# Patient Record
Sex: Female | Born: 1985 | Race: White | Hispanic: No | Marital: Married | Smoking: Never smoker
Health system: Southern US, Community
[De-identification: ages and names within clinical notes are randomized; demographics above are authoritative.]

## PROBLEM LIST (undated history)

## (undated) ENCOUNTER — Inpatient Hospital Stay (HOSPITAL_COMMUNITY): Payer: Self-pay

## (undated) DIAGNOSIS — N979 Female infertility, unspecified: Secondary | ICD-10-CM

## (undated) DIAGNOSIS — N809 Endometriosis, unspecified: Secondary | ICD-10-CM

## (undated) DIAGNOSIS — O09299 Supervision of pregnancy with other poor reproductive or obstetric history, unspecified trimester: Secondary | ICD-10-CM

## (undated) DIAGNOSIS — O139 Gestational [pregnancy-induced] hypertension without significant proteinuria, unspecified trimester: Secondary | ICD-10-CM

## (undated) DIAGNOSIS — N83209 Unspecified ovarian cyst, unspecified side: Secondary | ICD-10-CM

## (undated) HISTORY — PX: BREAST ENHANCEMENT SURGERY: SHX7

## (undated) HISTORY — PX: OTHER SURGICAL HISTORY: SHX169

## (undated) HISTORY — PX: BREAST SURGERY: SHX581

## (undated) HISTORY — DX: Supervision of pregnancy with other poor reproductive or obstetric history, unspecified trimester: O09.299

---
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
## 2008-10-12 ENCOUNTER — Telehealth (INDEPENDENT_AMBULATORY_CARE_PROVIDER_SITE_OTHER): Payer: Self-pay | Admitting: *Deleted

## 2016-02-27 ENCOUNTER — Encounter (HOSPITAL_COMMUNITY): Payer: Self-pay | Admitting: Emergency Medicine

## 2016-02-27 ENCOUNTER — Encounter (HOSPITAL_COMMUNITY): Payer: Self-pay | Admitting: *Deleted

## 2016-02-27 ENCOUNTER — Emergency Department (HOSPITAL_COMMUNITY)
Admission: EM | Admit: 2016-02-27 | Discharge: 2016-02-28 | Disposition: A | Discharge status: Home / Self Care | Payer: BLUE CROSS/BLUE SHIELD | Attending: Emergency Medicine | Admitting: Emergency Medicine

## 2016-02-27 ENCOUNTER — Ambulatory Visit (HOSPITAL_COMMUNITY)
Admission: EM | Admit: 2016-02-27 | Discharge: 2016-02-27 | Disposition: A | Discharge status: Home / Self Care | Payer: BLUE CROSS/BLUE SHIELD | Attending: Family Medicine | Admitting: Family Medicine

## 2016-02-27 DIAGNOSIS — R1031 Right lower quadrant pain: Secondary | ICD-10-CM | POA: Insufficient documentation

## 2016-02-27 DIAGNOSIS — R1011 Right upper quadrant pain: Secondary | ICD-10-CM

## 2016-02-27 DIAGNOSIS — R109 Unspecified abdominal pain: Secondary | ICD-10-CM

## 2016-02-27 DIAGNOSIS — M545 Low back pain: Secondary | ICD-10-CM | POA: Diagnosis present

## 2016-02-27 HISTORY — DX: Endometriosis, unspecified: N80.9

## 2016-02-27 LAB — POCT URINALYSIS DIP (DEVICE)
Bilirubin Urine: NEGATIVE
Glucose, UA: NEGATIVE mg/dL
Hgb urine dipstick: NEGATIVE
KETONES UR: NEGATIVE mg/dL
Leukocytes, UA: NEGATIVE
Nitrite: NEGATIVE
PH: 6 (ref 5.0–8.0)
PROTEIN: NEGATIVE mg/dL
SPECIFIC GRAVITY, URINE: 1.025 (ref 1.005–1.030)
Urobilinogen, UA: 0.2 mg/dL (ref 0.0–1.0)

## 2016-02-27 LAB — CBC
HCT: 39.6 % (ref 36.0–46.0)
HEMOGLOBIN: 13.2 g/dL (ref 12.0–15.0)
MCH: 30.8 pg (ref 26.0–34.0)
MCHC: 33.3 g/dL (ref 30.0–36.0)
MCV: 92.5 fL (ref 78.0–100.0)
PLATELETS: 176 10*3/uL (ref 150–400)
RBC: 4.28 MIL/uL (ref 3.87–5.11)
RDW: 12.6 % (ref 11.5–15.5)
WBC: 6.3 10*3/uL (ref 4.0–10.5)

## 2016-02-27 LAB — COMPREHENSIVE METABOLIC PANEL
ALT: 40 U/L (ref 14–54)
ANION GAP: 7 (ref 5–15)
AST: 30 U/L (ref 15–41)
Albumin: 4.1 g/dL (ref 3.5–5.0)
Alkaline Phosphatase: 47 U/L (ref 38–126)
BILIRUBIN TOTAL: 0.7 mg/dL (ref 0.3–1.2)
BUN: 14 mg/dL (ref 6–20)
CALCIUM: 9.5 mg/dL (ref 8.9–10.3)
CO2: 25 mmol/L (ref 22–32)
CREATININE: 0.8 mg/dL (ref 0.44–1.00)
Chloride: 107 mmol/L (ref 101–111)
Glucose, Bld: 113 mg/dL — ABNORMAL HIGH (ref 65–99)
Potassium: 3.6 mmol/L (ref 3.5–5.1)
Sodium: 139 mmol/L (ref 135–145)
TOTAL PROTEIN: 6.8 g/dL (ref 6.5–8.1)

## 2016-02-27 LAB — LIPASE, BLOOD: Lipase: 25 U/L (ref 11–51)

## 2016-02-27 LAB — POCT PREGNANCY, URINE: PREG TEST UR: NEGATIVE

## 2016-02-27 MED ORDER — IBUPROFEN 200 MG PO TABS
ORAL_TABLET | ORAL | Status: AC
  Filled 2016-02-27: qty 3

## 2016-02-27 MED ORDER — IBUPROFEN 400 MG PO TABS
600.0000 mg | ORAL_TABLET | Freq: Once | ORAL | Status: AC
  Administered 2016-02-27: 600 mg via ORAL

## 2016-02-27 NOTE — ED Provider Notes (Signed)
[facility]-EMERGENCY DEPT Provider Note   CSN: [id number] Arrival date & time: 02/27/16  2055  By signing my name below, I, Clovis PuAvnee Patel, attest that this documentation has been prepared under the direction and in the presence of  Damire Remedios, [place]. Electronically Signed: Clovis PuAvnee Patel, ED Scribe. 02/27/16. 12:00 AM.    History   Chief Complaint Chief Complaint  Patient presents with  . Back Pain  . Abdominal Pain     The history is provided by the patient. No language interpreter was used.    HPI Comments:  Desiree Santana is a 30 y.o. female who presents to the Emergency Department complaining of constant, moderate lower right back pain onset 5 PM today. Pt notes associated radiation to RLQ. She states when she is sitting the pain is "dull" and when she is standing and moving the pain is "sharp". She notes the severity of the pain as being a "6/10". Pt denies nausea, vomiting, diarrhea, vaginal bleeding, vaginal discharge, or urinary symptoms. Denies fever or chills. Pt notes she had a C-section in 2015 otherwise no other abdominal surgeries. Pt denies a hx of kidney stones or a recent injury. Denies heavy lifting. [facility] with negative UA and urine pregnancy. She has been taking ibuprofen for her pain.  Past Medical History:  Diagnosis Date  . Endometriosis     There are no active problems to display for this patient.   Past Surgical History:  Procedure Laterality Date  . BREAST ENHANCEMENT SURGERY    . laproscopic endometreosis      OB History    No data available       Home Medications    Prior to Admission medications   Not on File    Family History History reviewed. No pertinent family history.  Social History Social History  Substance Use Topics  . Smoking status: Never Smoker  . Smokeless tobacco: Never Used  . Alcohol use Yes     Comment: social     Allergies   Review of patient's allergies indicates no known  allergies.   Review of Systems Review of Systems 10 systems reviewed and all are negative for acute change except as noted in the HPI.   Physical Exam Updated Vital Signs BP 115/81 (BP Location: Left Arm)   Pulse 73   Temp 98.6 F (37 C) (Oral)   Resp 14   LMP 02/09/2016 (Exact Date)   SpO2 100%   Physical Exam  Constitutional: She appears well-developed and well-nourished. No distress.  Well appearing, NAD  HENT:  Head: Normocephalic and atraumatic.  Neck: Neck supple.  Pulmonary/Chest: Effort normal.  Abdominal: Soft. There is no rebound and no guarding.  Abdomen soft. Nontender. BS normal. No rebounding or guarding. No CVA tenderness. No pain when coughing.  Musculoskeletal:  No midline back tenderness No stepoff or deformity Ambulatory with steady gait  Neurological: She is alert.  Skin: She is not diaphoretic.  Nursing note and vitals reviewed.    ED Treatments / Results  DIAGNOSTIC STUDIES:  Oxygen Saturation is 100% on RA, normal by my interpretation.    COORDINATION OF CARE:  11:54 PM Discussed treatment plan with pt at bedside and pt agreed to plan.  Labs (all labs ordered are listed, but only abnormal results are displayed) Labs Reviewed  COMPREHENSIVE METABOLIC PANEL - Abnormal; Notable for the following:       Result Value   Glucose, Bld 113 (*)    All other  components within normal limits  LIPASE, BLOOD  CBC    EKG  EKG Interpretation None       Radiology No results found.  Procedures Procedures (including critical care time)  Medications Ordered in ED Medications  ibuprofen (ADVIL,MOTRIN) 200 MG tablet (not administered)  ibuprofen (ADVIL,MOTRIN) tablet 600 mg (600 mg Oral Given 02/27/16 2122)     Initial Impression / Assessment and Plan / ED Course  I have reviewed the triage vital signs and the nursing notes.  Pertinent labs & imaging results that were available during my care of the patient were reviewed by me and  considered in my medical decision making (see chart for details).  Clinical Course    Pt is an 30 y.o. female presenting with acute onset right flank pain radiating to her RLQ. Pain is intermittent, waxing waning in severity. No other associated symptoms. She is afebrile and exam nonfocal. No abdominal exam findings. Neuro exam intact. Her labs are unremarkable. Low suspicion for appendicitis, ovarian torsion, TOA, diverticulitis, kidney stone, cholecystitis. Discussed findings with pt. We will hold off on further imaging today. Pt is hemodynamically stable. The patient appears reasonably screened and/or stabilized for discharge and I doubt any other medical condition or other [facility] requiring further screening, evaluation, or treatment in the ED at this time prior to discharge. ER return precautions given.   Final Clinical Impressions(s) / ED Diagnoses   Final diagnoses:  Right flank pain    New Prescriptions New Prescriptions   No medications on file    I personally performed the services described in this documentation, which was scribed in my presence. The recorded information has been reviewed and is accurate.   Carlene Coria, PA-C 02/28/16 0005    Shon Baton, MD 02/28/16 [phone]

## 2016-02-27 NOTE — ED Provider Notes (Signed)
CSN: [id number]     Arrival date & time 02/27/16  1943 History   None    Chief Complaint  Patient presents with  . Abdominal Pain  . Back Pain   (Consider location/radiation/quality/duration/timing/severity/associated sxs/prior Treatment) HPI History obtained from patient:  Pt presents with the cc of:  Right flank and lower quadrant pain Duration of symptoms: 3 hours Treatment prior to arrival: None Context: Patient states that she was sitting on the floor and had sudden onset of right flank and lower right quadrant pain. Not associated with nausea vomiting or diarrhea. Other symptoms include: Radiating pain into the right groin. Pain score: 4 FAMILY HISTORY: None    Past Medical History:  Diagnosis Date  . Endometriosis    Past Surgical History:  Procedure Laterality Date  . BREAST ENHANCEMENT SURGERY    . laproscopic endometreosis     History reviewed. No pertinent family history. Social History  Substance Use Topics  . Smoking status: Never Smoker  . Smokeless tobacco: Never Used  . Alcohol use Yes     Comment: social   OB History    No data available     Review of Systems  Denies: HEADACHE, NAUSEA,  CHEST PAIN, CONGESTION, DYSURIA, SHORTNESS OF BREATH  Allergies  Review of patient's allergies indicates no known allergies.  Home Medications   Prior to Admission medications   Not on File   Meds Ordered and Administered this Visit  Medications - No data to display  LMP 02/09/2016 (Exact Date)  No data found.   Physical Exam NURSES NOTES AND VITAL SIGNS REVIEWED. CONSTITUTIONAL: Well developed, well nourished, no acute distress HEENT: normocephalic, atraumatic EYES: Conjunctiva normal NECK:normal ROM, supple, no adenopathy PULMONARY:No respiratory distress, normal effort ABDOMINAL: Soft, ND, NT BS+, minimal right lower quadrant tenderness. Some right flank tenderness. MUSCULOSKELETAL: Normal ROM of all extremities,  SKIN: warm and dry without  rash PSYCHIATRIC: Mood and affect, behavior are normal  Urgent Care Course   Clinical Course    Procedures (including critical care time)  Labs Review Labs Reviewed  POCT URINALYSIS DIP (DEVICE)  POCT PREGNANCY, URINE    Imaging Review No results found.   Visual Acuity Review  Right Eye Distance:   Left Eye Distance:   Bilateral Distance:    Right Eye Near:   Left Eye Near:    Bilateral Near:         MDM   1. Right lower quadrant abdominal pain   2. Acute right flank pain    It is suggested to patient that she be seen in the emergency department as she is beyond the diagnostic capabilities of urgent care.    Tharon AquasFrank C Hazle Ogburn, PA 02/27/16 2110

## 2016-02-27 NOTE — ED Triage Notes (Signed)
[facility] a [facility] lower back pain and abdominal pain that started today.

## 2016-02-27 NOTE — Discharge Instructions (Signed)
Take ibuprofen or tylenol as needed for pain. As we discussed, your labs today and at urgent care were normal. Return to the emergency room for new or worsening symptoms.

## 2016-02-27 NOTE — Discharge Instructions (Signed)
IT IS SUGGESTED THAT YOU BE SEEN IN THE ER FOR YOUR ACUTE ABDOMINAL PAIN  THE URGENT CARE IS VERY LIMITED ON ITS ABILITY TO EVALUATE ABDOMINAL PAIN.

## 2016-02-27 NOTE — ED Triage Notes (Signed)
Pt reports right lower back pain starting today around 1700 tonight. Pt was sitting on the floor when onset of pain occurred. Pt denies taking any OTC medications for the pain. Pt states pain radiates to right lower abdominal, pt denies dysuria, hematuria. Pt denies fever, chills, n/v/d. Pt was seen at UC, had UA done with normal results.

## 2016-05-30 LAB — OB RESULTS CONSOLE ABO/RH: RH Type: NEGATIVE

## 2016-05-30 LAB — OB RESULTS CONSOLE RPR: RPR: NONREACTIVE

## 2016-05-30 LAB — OB RESULTS CONSOLE GC/CHLAMYDIA
CHLAMYDIA, DNA PROBE: NEGATIVE
GC PROBE AMP, GENITAL: NEGATIVE

## 2016-05-30 LAB — OB RESULTS CONSOLE RUBELLA ANTIBODY, IGM: RUBELLA: IMMUNE

## 2016-05-30 LAB — OB RESULTS CONSOLE HEPATITIS B SURFACE ANTIGEN: HEP B S AG: NEGATIVE

## 2016-05-30 LAB — OB RESULTS CONSOLE HIV ANTIBODY (ROUTINE TESTING): HIV: NONREACTIVE

## 2016-05-30 LAB — OB RESULTS CONSOLE ANTIBODY SCREEN: ANTIBODY SCREEN: NEGATIVE

## 2016-06-18 NOTE — L&D Delivery Note (Signed)
Delivery Note At 3:58 PM a viable female was delivered via VBAC, Spontaneous ROA Presentation   APGAR: 9, 9; weight pending  .   Placenta status:spontaneously with 3 vessel cord  , .  Cord:  with the following complications: none.  Cord pH: not obtained  No uterine window palpated  Anesthesia:  epidural Episiotomy: None Lacerations: Labial Suture Repair: 3.0 chromic Est. Blood Loss (mL): 300  Mom to postpartum.  Baby to Couplet care / Skin to Skin.  Desiree Santana L 01/05/2017, 4:16 PM

## 2016-12-11 ENCOUNTER — Inpatient Hospital Stay (HOSPITAL_COMMUNITY)
Admission: AD | Admit: 2016-12-11 | Discharge: 2016-12-11 | Disposition: A | Discharge status: Home / Self Care | Payer: BLUE CROSS/BLUE SHIELD | Source: Ambulatory Visit | Attending: Obstetrics and Gynecology | Admitting: Obstetrics and Gynecology

## 2016-12-11 ENCOUNTER — Encounter (HOSPITAL_COMMUNITY): Payer: Self-pay | Admitting: *Deleted

## 2016-12-11 DIAGNOSIS — Z3A36 36 weeks gestation of pregnancy: Secondary | ICD-10-CM | POA: Insufficient documentation

## 2016-12-11 DIAGNOSIS — R42 Dizziness and giddiness: Secondary | ICD-10-CM | POA: Insufficient documentation

## 2016-12-11 DIAGNOSIS — O26893 Other specified pregnancy related conditions, third trimester: Secondary | ICD-10-CM | POA: Diagnosis present

## 2016-12-11 DIAGNOSIS — D696 Thrombocytopenia, unspecified: Secondary | ICD-10-CM | POA: Insufficient documentation

## 2016-12-11 DIAGNOSIS — O99113 Other diseases of the blood and blood-forming organs and certain disorders involving the immune mechanism complicating pregnancy, third trimester: Secondary | ICD-10-CM | POA: Insufficient documentation

## 2016-12-11 DIAGNOSIS — O99119 Other diseases of the blood and blood-forming organs and certain disorders involving the immune mechanism complicating pregnancy, unspecified trimester: Secondary | ICD-10-CM | POA: Diagnosis not present

## 2016-12-11 DIAGNOSIS — O9989 Other specified diseases and conditions complicating pregnancy, childbirth and the puerperium: Secondary | ICD-10-CM | POA: Diagnosis not present

## 2016-12-11 DIAGNOSIS — R51 Headache: Secondary | ICD-10-CM | POA: Diagnosis not present

## 2016-12-11 DIAGNOSIS — O09293 Supervision of pregnancy with other poor reproductive or obstetric history, third trimester: Secondary | ICD-10-CM

## 2016-12-11 HISTORY — DX: Gestational (pregnancy-induced) hypertension without significant proteinuria, unspecified trimester: O13.9

## 2016-12-11 HISTORY — DX: Female infertility, unspecified: N97.9

## 2016-12-11 HISTORY — DX: Unspecified ovarian cyst, unspecified side: N83.209

## 2016-12-11 LAB — COMPREHENSIVE METABOLIC PANEL
ALT: 26 U/L (ref 14–54)
ANION GAP: 7 (ref 5–15)
AST: 30 U/L (ref 15–41)
Albumin: 2.8 g/dL — ABNORMAL LOW (ref 3.5–5.0)
Alkaline Phosphatase: 121 U/L (ref 38–126)
BUN: 9 mg/dL (ref 6–20)
CHLORIDE: 107 mmol/L (ref 101–111)
CO2: 19 mmol/L — ABNORMAL LOW (ref 22–32)
CREATININE: 0.47 mg/dL (ref 0.44–1.00)
Calcium: 8.4 mg/dL — ABNORMAL LOW (ref 8.9–10.3)
GFR calc Af Amer: >60 mL/min (ref >60)
Glucose, Bld: 90 mg/dL (ref 65–99)
POTASSIUM: 3.2 mmol/L — AB (ref 3.5–5.1)
Sodium: 133 mmol/L — ABNORMAL LOW (ref 135–145)
Total Bilirubin: 0.4 mg/dL (ref 0.3–1.2)
Total Protein: 6.1 g/dL — ABNORMAL LOW (ref 6.5–8.1)

## 2016-12-11 LAB — CBC
HCT: 36.7 % (ref 36.0–46.0)
Hemoglobin: 12.8 g/dL (ref 12.0–15.0)
MCH: 32.7 pg (ref 26.0–34.0)
MCHC: 34.9 g/dL (ref 30.0–36.0)
MCV: 93.6 fL (ref 78.0–100.0)
PLATELETS: 123 10*3/uL — AB (ref 150–400)
RBC: 3.92 MIL/uL (ref 3.87–5.11)
RDW: 13 % (ref 11.5–15.5)
WBC: 5.6 10*3/uL (ref 4.0–10.5)

## 2016-12-11 LAB — URINALYSIS, ROUTINE W REFLEX MICROSCOPIC
BILIRUBIN URINE: NEGATIVE
Glucose, UA: NEGATIVE mg/dL
HGB URINE DIPSTICK: NEGATIVE
KETONES UR: 5 mg/dL — AB
Leukocytes, UA: NEGATIVE
Nitrite: NEGATIVE
PH: 6 (ref 5.0–8.0)
Protein, ur: NEGATIVE mg/dL
SPECIFIC GRAVITY, URINE: 1.005 (ref 1.005–1.030)

## 2016-12-11 LAB — PROTEIN / CREATININE RATIO, URINE
CREATININE, URINE: 45 mg/dL
Total Protein, Urine: <6 mg/dL

## 2016-12-11 MED ORDER — BUTALBITAL-APAP-CAFFEINE 50-325-40 MG PO TABS
1.0000 | ORAL_TABLET | Freq: Once | ORAL | Status: AC
  Administered 2016-12-11: 1 via ORAL
  Filled 2016-12-11: qty 1

## 2016-12-11 MED ORDER — FAMOTIDINE 20 MG PO TABS
20.0000 mg | ORAL_TABLET | Freq: Once | ORAL | Status: AC
  Administered 2016-12-11: 20 mg via ORAL
  Filled 2016-12-11: qty 1

## 2016-12-11 MED ORDER — BUTALBITAL-APAP-CAFFEINE 50-325-40 MG PO CAPS: 1.0000 | ORAL_CAPSULE | Freq: Four times a day (QID) | ORAL | 3 refills | Status: DC | PRN

## 2016-12-11 MED ORDER — RANITIDINE HCL 75 MG PO TABS: 75.0000 mg | ORAL_TABLET | Freq: Two times a day (BID) | ORAL | 1 refills | Status: DC

## 2016-12-11 NOTE — MAU Note (Signed)
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  Starting to have same symptoms, started yesterday.  HA, indigestion/heartburn, dizzy.

## 2016-12-11 NOTE — Discharge Instructions (Signed)
Thrombocytopenia Thrombocytopenia is a condition in which you have an abnormally decreased number of platelets in your blood. Platelets are also called thrombocytes. Platelets are needed for blood clotting. Some cases of thrombocytopenia are mild while others are more severe. What are the causes? This condition may be caused by:  Decreased production of platelets. This can be caused by: ? Aplastic anemia, in which your bone marrow quits making blood cells. ? Cancer in the bone marrow. ? Use of certain medicines, including chemotherapy. ? Infection in the bone marrow. ? Heavy alcohol consumption.  Increased destruction of platelets. This can be caused by: ? Certain immune diseases. ? Use of certain drugs. ? Certain blood clotting disorders. ? Certain inherited disorders. ? Certain bleeding disorders. ? Pregnancy.  Having an enlarged spleen (hypersplenism). In hypersplenism, the spleen gathers up platelets from circulation. This means that the platelets are not available to help with blood clotting. The spleen can be enlarged because of cirrhosis or other conditions.  What are the signs or symptoms? Symptoms of this condition are side effects of poor blood clotting. They will vary depending on how low the platelet counts are. Symptoms may include:  Abnormal bleeding.  Nosebleeds.  Heavy menstrual periods.  Blood in the urine or stool (feces).  A purplish discoloration in the skin (purpura).  Bruising.  A rash that looks like pinpoint, purplish-red spots (petechiae) on the skin and mucous membranes.  How is this diagnosed? This condition may be diagnosed with blood tests and a physical exam. Sometimes, a sample of bone marrow may be removed to look for the original cells (megakaryocytes) that make platelets. How is this treated? Treatment for this condition depends on the cause. Treatment options may include:  Treatment of another condition that is causing the low platelet  count.  Medicines to help protect your platelets from being destroyed.  A replacement (transfusion) of platelets to stop or prevent bleeding.  Surgery to remove the spleen.  Follow these instructions at home: General instructions  Check your skin and the linings inside your mouth for bruising or bleeding as told by your health care provider.  Check your sputum, urine, and stool for blood as told by your health care provider.  Ask your health care provider if it is okay for you to drink alcohol.  Take over-the-counter and prescription medicines only as told by your health care provider.  Tell all of your health care providers, including dentists and eye doctors, about your condition. Activity  Until your health care provider says it is okay. ? Do not return to any activities that could cause bumps or bruises.  Take extra care not to cut yourself when you shave or when you use scissors, needles, knives, and other tools.  Take extra care not to burn yourself when ironing or cooking. Contact a health care provider if:  You have unexplained bruising. Get help right away if:  You have active bleeding from anywhere on your body.  You have blood in your sputum, urine, or stool. This information is not intended to replace advice given to you by your health care provider. Make sure you discuss any questions you have with your health care provider. Document Released: 06/04/2005 Document Revised: 02/05/2016 Document Reviewed: 12/06/2014 Elsevier Interactive Patient Education  2018 [employer]. Third Trimester of Pregnancy The third trimester is from week 28 through week 40 (months 7 through 9). The third trimester is a time when the unborn baby (fetus) is growing rapidly. At the end of  the ninth month, the fetus is about 20 inches in length and weighs 6-10 pounds. Body changes during your third trimester Your body will continue to go through many changes during pregnancy. The changes  vary from woman to woman. During the third trimester:  Your weight will continue to increase. You can expect to gain 25-35 pounds (11-16 kg) by the end of the pregnancy.  You may begin to get stretch marks on your hips, abdomen, and breasts.  You may urinate more often because the fetus is moving lower into your pelvis and pressing on your bladder.  You may develop or continue to have heartburn. This is caused by increased hormones that slow down muscles in the digestive tract.  You may develop or continue to have constipation because increased hormones slow digestion and cause the muscles that push waste through your intestines to relax.  You may develop hemorrhoids. These are swollen veins (varicose veins) in the rectum that can itch or be painful.  You may develop swollen, bulging veins (varicose veins) in your legs.  You may have increased body aches in the pelvis, back, or thighs. This is due to weight gain and increased hormones that are relaxing your joints.  You may have changes in your hair. These can include thickening of your hair, rapid growth, and changes in texture. Some women also have hair loss during or after pregnancy, or hair that feels dry or thin. Your hair will most likely return to normal after your baby is born.  Your breasts will continue to grow and they will continue to become tender. A yellow fluid (colostrum) may leak from your breasts. This is the first milk you are producing for your baby.  Your belly button may stick out.  You may notice more swelling in your hands, face, or ankles.  You may have increased tingling or numbness in your hands, arms, and legs. The skin on your belly may also feel numb.  You may feel short of breath because of your expanding uterus.  You may have more problems sleeping. This can be caused by the size of your belly, increased need to urinate, and an increase in your body's metabolism.  You may notice the fetus "dropping," or  moving lower in your abdomen (lightening).  You may have increased vaginal discharge.  You may notice your joints feel loose and you may have pain around your pelvic bone.  What to expect at prenatal visits You will have prenatal exams every 2 weeks until week 36. Then you will have weekly prenatal exams. During a routine prenatal visit:  You will be weighed to make sure you and the baby are growing normally.  Your blood pressure will be taken.  Your abdomen will be measured to track your baby's growth.  The fetal heartbeat will be listened to.  Any test results from the previous visit will be discussed.  You may have a cervical check near your due date to see if your cervix has softened or thinned (effaced).  You will be tested for Group B streptococcus. This happens between 35 and 37 weeks.  Your health care provider may ask you:  What your birth plan is.  How you are feeling.  If you are feeling the baby move.  If you have had any abnormal symptoms, such as leaking fluid, bleeding, severe headaches, or abdominal cramping.  If you are using any tobacco products, including cigarettes, chewing tobacco, and electronic cigarettes.  If you have any questions.  Other tests or screenings that may be performed during your third trimester include:  Blood tests that check for low iron levels (anemia).  Fetal testing to check the health, activity level, and growth of the fetus. Testing is done if you have certain medical conditions or if there are problems during the pregnancy.  Nonstress test (NST). This test checks the health of your baby to make sure there are no signs of problems, such as the baby not getting enough oxygen. During this test, a belt is placed around your belly. The baby is made to move, and its heart rate is monitored during movement.  What is false labor? False labor is a condition in which you feel small, irregular tightenings of the muscles in the womb  (contractions) that usually go away with rest, changing position, or drinking water. These are called Braxton Hicks contractions. Contractions may last for hours, days, or even weeks before true labor sets in. If contractions come at regular intervals, become more frequent, increase in intensity, or become painful, you should see your health care provider. What are the signs of labor?  Abdominal cramps.  Regular contractions that start at 10 minutes apart and become stronger and more frequent with time.  Contractions that start on the top of the uterus and spread down to the lower abdomen and back.  Increased pelvic pressure and dull back pain.  A watery or bloody mucus discharge that comes from the vagina.  Leaking of amniotic fluid. This is also known as your "water breaking." It could be a slow trickle or a gush. Let your health care provider know if it has a color or strange odor. If you have any of these signs, call your health care provider right away, even if it is before your due date. Follow these instructions at home: Medicines  Follow your health care provider's instructions regarding medicine use. Specific medicines may be either safe or unsafe to take during pregnancy.  Take a prenatal vitamin that contains at least 600 micrograms (mcg) of folic acid.  If you develop constipation, try taking a stool softener if your health care provider approves. Eating and drinking  Eat a balanced diet that includes fresh fruits and vegetables, whole grains, good sources of protein such as meat, eggs, or tofu, and low-fat dairy. Your health care provider will help you determine the amount of weight gain that is right for you.  Avoid raw meat and uncooked cheese. These carry germs that can cause birth defects in the baby.  If you have low calcium intake from food, talk to your health care provider about whether you should take a daily calcium supplement.  Eat four or five small meals  rather than three large meals a day.  Limit foods that are high in fat and processed sugars, such as fried and sweet foods.  To prevent constipation: ? Drink enough fluid to keep your urine clear or pale yellow. ? Eat foods that are high in fiber, such as fresh fruits and vegetables, whole grains, and beans. Activity  Exercise only as directed by your health care provider. Most women can continue their usual exercise routine during pregnancy. Try to exercise for 30 minutes at least 5 days a week. Stop exercising if you experience uterine contractions.  Avoid heavy lifting.  Do not exercise in extreme heat or humidity, or at high altitudes.  Wear low-heel, comfortable shoes.  Practice good posture.  You may continue to have sex unless your health care provider  tells you otherwise. Relieving pain and discomfort  Take frequent breaks and rest with your legs elevated if you have leg cramps or low back pain.  Take warm sitz baths to soothe any pain or discomfort caused by hemorrhoids. Use hemorrhoid cream if your health care provider approves.  Wear a good support bra to prevent discomfort from breast tenderness.  If you develop varicose veins: ? Wear support pantyhose or compression stockings as told by your healthcare provider. ? Elevate your feet for 15 minutes, 3-4 times a day. Prenatal care  Write down your questions. Take them to your prenatal visits.  Keep all your prenatal visits as told by your health care provider. This is important. Safety  Wear your seat belt at all times when driving.  Make a list of emergency phone numbers, including numbers for family, friends, the hospital, and police and fire departments. General instructions  Avoid cat litter boxes and soil used by cats. These carry germs that can cause birth defects in the baby. If you have a cat, ask someone to clean the litter box for you.  Do not travel far distances unless it is absolutely necessary and  only with the approval of your health care provider.  Do not use hot tubs, steam rooms, or saunas.  Do not drink alcohol.  Do not use any products that contain nicotine or tobacco, such as cigarettes and e-cigarettes. If you need help quitting, ask your health care provider.  Do not use any medicinal herbs or unprescribed drugs. These chemicals affect the formation and growth of the baby.  Do not douche or use tampons or scented sanitary pads.  Do not cross your legs for long periods of time.  To prepare for the arrival of your baby: ? Take prenatal classes to understand, practice, and ask questions about labor and delivery. ? Make a trial run to the hospital. ? Visit the hospital and tour the maternity area. ? Arrange for maternity or paternity leave through employers. ? Arrange for family and friends to take care of pets while you are in the hospital. ? Purchase a rear-facing car seat and make sure you know how to install it in your car. ? Pack your hospital bag. ? Prepare the babys nursery. Make sure to remove all pillows and stuffed animals from the baby's crib to prevent suffocation.  Visit your dentist if you have not gone during your pregnancy. Use a soft toothbrush to brush your teeth and be gentle when you floss. Contact a health care provider if:  You are unsure if you are in labor or if your water has broken.  You become dizzy.  You have mild pelvic cramps, pelvic pressure, or nagging pain in your abdominal area.  You have lower back pain.  You have persistent nausea, vomiting, or diarrhea.  You have an unusual or bad smelling vaginal discharge.  You have pain when you urinate. Get help right away if:  Your water breaks before 37 weeks.  You have regular contractions less than 5 minutes apart before 37 weeks.  You have a fever.  You are leaking fluid from your vagina.  You have spotting or bleeding from your vagina.  You have severe abdominal pain or  cramping.  You have rapid weight loss or weight gain.  You have shortness of breath with chest pain.  You notice sudden or extreme swelling of your face, hands, ankles, feet, or legs.  Your baby makes fewer than 10 movements in 2 hours.  You have severe headaches that do not go away when you take medicine.  You have vision changes. Summary  The third trimester is from week 28 through week 40, months 7 through 9. The third trimester is a time when the unborn baby (fetus) is growing rapidly.  During the third trimester, your discomfort may increase as you and your baby continue to gain weight. You may have abdominal, leg, and back pain, sleeping problems, and an increased need to urinate.  During the third trimester your breasts will keep growing and they will continue to become tender. A yellow fluid (colostrum) may leak from your breasts. This is the first milk you are producing for your baby.  False labor is a condition in which you feel small, irregular tightenings of the muscles in the womb (contractions) that eventually go away. These are called Braxton Hicks contractions. Contractions may last for hours, days, or even weeks before true labor sets in.  Signs of labor can include: abdominal cramps; regular contractions that start at 10 minutes apart and become stronger and more frequent with time; watery or bloody mucus discharge that comes from the vagina; increased pelvic pressure and dull back pain; and leaking of amniotic fluid. This information is not intended to replace advice given to you by your health care provider. Make sure you discuss any questions you have with your health care provider. Document Released: 05/29/2001 Document Revised: 11/10/2015 Document Reviewed: 08/05/2012 Elsevier Interactive Patient Education  2017 [employer].

## 2016-12-11 NOTE — MAU Provider Note (Signed)
Chief Complaint:  No chief complaint on file.   First Provider Initiated Contact with Patient 12/11/16 1759     HPI: Desiree Santana is a 31 y.o. No obstetric history on file[facility] headache (partially relieved by Tylenol), heartburn, and dizziness. She reports good fetal movement, denies LOF, vaginal bleeding, vaginal itching/burning, urinary symptoms, h/a, dizziness, n/v, diarrhea, constipation or fever/chills.  She denies visual changes. 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 Never had elevated BPs until late.  Lab abnormalities primarily. [place]  Headache   This is a new problem. The current episode started today. The problem occurs constantly. The problem has been gradually improving. The pain is located in the frontal region. The pain does not radiate. Associated symptoms include dizziness. Pertinent negatives include no abdominal pain, back pain, blurred vision, nausea, neck pain, photophobia, visual change or weakness. Nothing aggravates the symptoms. She has tried nothing for the symptoms.  Gastroesophageal Reflux  She reports no abdominal pain or no nausea. This is a new problem. The current episode started today. The problem occurs constantly. The problem has been unchanged. Nothing aggravates the symptoms. She has tried nothing for the symptoms.  Dizziness  This is a new problem. The current episode started today. The problem occurs intermittently. The problem has been unchanged. Associated symptoms include headaches. Pertinent negatives include no abdominal pain, nausea, neck pain, visual change or weakness. Nothing aggravates the symptoms. She has tried nothing for the symptoms.    Past Medical History: Past Medical History:  Diagnosis Date  . Endometriosis     Past obstetric history: OB History  No data available    Past Surgical History: Past Surgical History:   Procedure Laterality Date  . BREAST ENHANCEMENT SURGERY    . laproscopic endometreosis      Family History: No family history on file.  Social History: Social History  Substance Use Topics  . Smoking status: Never Smoker  . Smokeless tobacco: Never Used  . Alcohol use Yes     Comment: social    Allergies: No Known Allergies  Meds:  No prescriptions prior to admission.    I have reviewed patient's Past Medical Hx, Surgical Hx, Family Hx, Social Hx, medications and allergies.   ROS:  Review of Systems  Eyes: Negative for blurred vision and photophobia.  Gastrointestinal: Negative for abdominal pain and nausea.  Musculoskeletal: Negative for back pain and neck pain.  Neurological: Positive for dizziness and headaches. Negative for weakness.   Other systems negative  Physical Exam   Vitals:   12/11/16 1912 12/11/16 1915 12/11/16 1930 12/11/16 2010  BP:  112/73 (!) 98/55   Pulse:  82 83   Resp:      Temp:    97.6 F (36.4 C)  TempSrc:    Oral  SpO2: 100%       Constitutional: Well-developed, well-nourished female in no acute distress.  Cardiovascular: normal rate and rhythm Respiratory: normal effort, clear to auscultation bilaterally GI: Abd soft, non-tender, gravid appropriate for gestational age.   No rebound or guarding. MS: Extremities nontender, no edema, normal ROM Neurologic: Alert and oriented x 4. DTRs 1+ with no clonus. GU: Neg CVAT.  PELVIC EXAM: deferred    FHT:  Baseline 140 , moderate variability, accelerations present, no decelerations Contractions: q 2-7 mins Irregular     Labs:    Results for orders placed or performed during the hospital encounter  of 12/11/16 (from the past 24 hour(s))  Urinalysis, Routine w reflex microscopic     Status: Abnormal   Collection Time: 12/11/16  5:50 PM  Result Value Ref Range   Color, Urine YELLOW YELLOW   APPearance CLEAR CLEAR   Specific Gravity, Urine 1.005 1.005 - 1.030   pH 6.0 5.0 - 8.0    Glucose, UA NEGATIVE NEGATIVE mg/dL   Hgb urine dipstick NEGATIVE NEGATIVE   Bilirubin Urine NEGATIVE NEGATIVE   Ketones, ur 5 (A) NEGATIVE mg/dL   Protein, ur NEGATIVE NEGATIVE mg/dL   Nitrite NEGATIVE NEGATIVE   Leukocytes, UA NEGATIVE NEGATIVE  Protein / creatinine ratio, urine     Status: None   Collection Time: 12/11/16  5:50 PM  Result Value Ref Range   Creatinine, Urine 45.00 mg/dL   Total Protein, Urine <6 mg/dL   Protein Creatinine Ratio        0.00 - 0.15 mg/mg[Cre]  CBC     Status: Abnormal   Collection Time: 12/11/16  6:24 PM  Result Value Ref Range   WBC 5.6 4.0 - 10.5 K/uL   RBC 3.92 3.87 - 5.11 MIL/uL   Hemoglobin 12.8 12.0 - 15.0 g/dL   HCT 16.1 09.6 - 04.5 %   MCV 93.6 78.0 - 100.0 fL   MCH 32.7 26.0 - 34.0 pg   MCHC 34.9 30.0 - 36.0 g/dL   RDW 40.9 81.1 - 91.4 %   Platelets 123 (L) 150 - 400 K/uL  Comprehensive metabolic panel     Status: Abnormal   Collection Time: 12/11/16  6:24 PM  Result Value Ref Range   Sodium 133 (L) 135 - 145 mmol/L   Potassium 3.2 (L) 3.5 - 5.1 mmol/L   Chloride 107 101 - 111 mmol/L   CO2 19 (L) 22 - 32 mmol/L   Glucose, Bld 90 65 - 99 mg/dL   BUN 9 6 - 20 mg/dL   Creatinine, Ser 7.82 0.44 - 1.00 mg/dL   Calcium 8.4 (L) 8.9 - 10.3 mg/dL   Total Protein 6.1 (L) 6.5 - 8.1 g/dL   Albumin 2.8 (L) 3.5 - 5.0 g/dL   AST 30 15 - 41 U/L   ALT 26 14 - 54 U/L   Alkaline Phosphatase 121 38 - 126 U/L   Total Bilirubin 0.4 0.3 - 1.2 mg/dL   GFR calc non Af Amer >60 >60 mL/min   GFR calc Af Amer >60 >60 mL/min   Anion gap 7 5 - 15     Imaging:  No results found.  MAU Course/MDM: I have ordered labs and reviewed results.  NST reviewed Consult Dr Elon Spanner with presentation, exam findings and test results.  Treatments in MAU included EFM.    Assessment: Thrombocytopenia complicating pregnancy (HCC) - Plan: Discharge patient  H/O HELLP syndrome, currently pregnant, third trimester - Plan: Discharge patient   Plan: Discharge  home Labor precautions and fetal kick counts Follow up in Office for prenatal visits and recheck of platelets next week Preeclampsia precautions  Pt stable at time of discharge.  Wynelle Bourgeois CNM, MSN Certified Nurse-Midwife 12/11/2016 5:59 PM

## 2017-01-04 ENCOUNTER — Encounter (HOSPITAL_COMMUNITY): Payer: Self-pay | Admitting: *Deleted

## 2017-01-04 ENCOUNTER — Telehealth (HOSPITAL_COMMUNITY): Payer: Self-pay | Admitting: *Deleted

## 2017-01-04 LAB — OB RESULTS CONSOLE GBS: GBS: NEGATIVE

## 2017-01-04 NOTE — Telephone Encounter (Signed)
Preadmission screen  

## 2017-01-05 ENCOUNTER — Inpatient Hospital Stay (HOSPITAL_COMMUNITY)
Admission: AD | Admit: 2017-01-05 | Discharge: 2017-01-07 | DRG: 775 | Disposition: A | Discharge status: Home / Self Care | Payer: BLUE CROSS/BLUE SHIELD | Source: Ambulatory Visit | Attending: Obstetrics and Gynecology | Admitting: Obstetrics and Gynecology

## 2017-01-05 ENCOUNTER — Inpatient Hospital Stay (HOSPITAL_COMMUNITY): Payer: BLUE CROSS/BLUE SHIELD | Admitting: Anesthesiology

## 2017-01-05 ENCOUNTER — Encounter (HOSPITAL_COMMUNITY): Payer: Self-pay

## 2017-01-05 DIAGNOSIS — Z3493 Encounter for supervision of normal pregnancy, unspecified, third trimester: Secondary | ICD-10-CM | POA: Diagnosis present

## 2017-01-05 DIAGNOSIS — O34211 Maternal care for low transverse scar from previous cesarean delivery: Secondary | ICD-10-CM | POA: Diagnosis present

## 2017-01-05 DIAGNOSIS — Z3A39 39 weeks gestation of pregnancy: Secondary | ICD-10-CM

## 2017-01-05 LAB — TYPE AND SCREEN
ABO/RH(D): A NEG
Antibody Screen: NEGATIVE

## 2017-01-05 LAB — ABO/RH: ABO/RH(D): A NEG

## 2017-01-05 LAB — URINALYSIS, ROUTINE W REFLEX MICROSCOPIC
BILIRUBIN URINE: NEGATIVE
Glucose, UA: NEGATIVE mg/dL
KETONES UR: NEGATIVE mg/dL
LEUKOCYTES UA: NEGATIVE
Nitrite: NEGATIVE
Protein, ur: NEGATIVE mg/dL
Specific Gravity, Urine: 1.014 (ref 1.005–1.030)
pH: 6 (ref 5.0–8.0)

## 2017-01-05 LAB — CBC
HEMATOCRIT: 35.7 % — AB (ref 36.0–46.0)
Hemoglobin: 12.6 g/dL (ref 12.0–15.0)
MCH: 32.8 pg (ref 26.0–34.0)
MCHC: 35.3 g/dL (ref 30.0–36.0)
MCV: 93 fL (ref 78.0–100.0)
Platelets: 126 10*3/uL — ABNORMAL LOW (ref 150–400)
RBC: 3.84 MIL/uL — ABNORMAL LOW (ref 3.87–5.11)
RDW: 13.2 % (ref 11.5–15.5)
WBC: 14.1 10*3/uL — ABNORMAL HIGH (ref 4.0–10.5)

## 2017-01-05 MED ORDER — SENNOSIDES-DOCUSATE SODIUM 8.6-50 MG PO TABS
2.0000 | ORAL_TABLET | ORAL | Status: DC
  Administered 2017-01-06 (×2): 2 via ORAL
  Filled 2017-01-05 (×2): qty 2

## 2017-01-05 MED ORDER — LIDOCAINE HCL (PF) 1 % IJ SOLN
30.0000 mL | INTRAMUSCULAR | Status: DC | PRN
  Filled 2017-01-05: qty 30

## 2017-01-05 MED ORDER — OXYCODONE-ACETAMINOPHEN 5-325 MG PO TABS: 2.0000 | ORAL_TABLET | ORAL | Status: DC | PRN

## 2017-01-05 MED ORDER — SIMETHICONE 80 MG PO CHEW: 80.0000 mg | CHEWABLE_TABLET | ORAL | Status: DC | PRN

## 2017-01-05 MED ORDER — LACTATED RINGERS IV SOLN: 500.0000 mL | INTRAVENOUS | Status: DC | PRN

## 2017-01-05 MED ORDER — PHENYLEPHRINE 40 MCG/ML (10ML) SYRINGE FOR IV PUSH (FOR BLOOD PRESSURE SUPPORT)
PREFILLED_SYRINGE | INTRAVENOUS | Status: AC
  Filled 2017-01-05: qty 10

## 2017-01-05 MED ORDER — OXYCODONE-ACETAMINOPHEN 5-325 MG PO TABS: 1.0000 | ORAL_TABLET | ORAL | Status: DC | PRN

## 2017-01-05 MED ORDER — COCONUT OIL OIL
1.0000 "application " | TOPICAL_OIL | Status: DC | PRN
  Filled 2017-01-05: qty 120

## 2017-01-05 MED ORDER — TETANUS-DIPHTH-ACELL PERTUSSIS 5-2.5-18.5 LF-MCG/0.5 IM SUSP: 0.5000 mL | Freq: Once | INTRAMUSCULAR | Status: DC

## 2017-01-05 MED ORDER — OXYTOCIN 40 UNITS IN LACTATED RINGERS INFUSION - SIMPLE MED
1.0000 m[IU]/min | INTRAVENOUS | Status: DC
  Administered 2017-01-05: 1 m[IU]/min via INTRAVENOUS

## 2017-01-05 MED ORDER — MEASLES, MUMPS & RUBELLA VAC ~~LOC~~ INJ: 0.5000 mL | INJECTION | Freq: Once | SUBCUTANEOUS | Status: DC

## 2017-01-05 MED ORDER — FENTANYL 2.5 MCG/ML BUPIVACAINE 1/10 % EPIDURAL INFUSION (WH - ANES)
14.0000 mL/h | INTRAMUSCULAR | Status: DC | PRN
  Administered 2017-01-05 (×2): 14 mL/h via EPIDURAL
  Filled 2017-01-05: qty 100

## 2017-01-05 MED ORDER — DIPHENHYDRAMINE HCL 50 MG/ML IJ SOLN
12.5000 mg | INTRAMUSCULAR | Status: DC | PRN
Start: 2017-01-05 — End: 2017-01-05

## 2017-01-05 MED ORDER — EPHEDRINE 5 MG/ML INJ: 10.0000 mg | INTRAVENOUS | Status: DC | PRN

## 2017-01-05 MED ORDER — ONDANSETRON HCL 4 MG PO TABS: 4.0000 mg | ORAL_TABLET | ORAL | Status: DC | PRN

## 2017-01-05 MED ORDER — ACETAMINOPHEN 325 MG PO TABS
650.0000 mg | ORAL_TABLET | ORAL | Status: DC | PRN
  Administered 2017-01-07: 650 mg via ORAL
  Filled 2017-01-05: qty 2

## 2017-01-05 MED ORDER — DIPHENHYDRAMINE HCL 50 MG/ML IJ SOLN: 12.5000 mg | INTRAMUSCULAR | Status: DC | PRN

## 2017-01-05 MED ORDER — FLEET ENEMA 7-19 GM/118ML RE ENEM: 1.0000 | ENEMA | Freq: Every day | RECTAL | Status: DC | PRN

## 2017-01-05 MED ORDER — ZOLPIDEM TARTRATE 5 MG PO TABS: 5.0000 mg | ORAL_TABLET | Freq: Every evening | ORAL | Status: DC | PRN

## 2017-01-05 MED ORDER — PHENYLEPHRINE 40 MCG/ML (10ML) SYRINGE FOR IV PUSH (FOR BLOOD PRESSURE SUPPORT)
80.0000 ug | PREFILLED_SYRINGE | INTRAVENOUS | Status: DC | PRN
  Filled 2017-01-05: qty 5

## 2017-01-05 MED ORDER — DIPHENHYDRAMINE HCL 25 MG PO CAPS: 25.0000 mg | ORAL_CAPSULE | Freq: Four times a day (QID) | ORAL | Status: DC | PRN

## 2017-01-05 MED ORDER — DIBUCAINE 1 % RE OINT
1.0000 "application " | TOPICAL_OINTMENT | RECTAL | Status: DC | PRN
  Administered 2017-01-06: 1 via RECTAL
  Filled 2017-01-05: qty 28

## 2017-01-05 MED ORDER — LIDOCAINE HCL (PF) 1 % IJ SOLN
INTRAMUSCULAR | Status: DC | PRN
  Administered 2017-01-05: 5 mL
  Administered 2017-01-05: 5 mL via EPIDURAL

## 2017-01-05 MED ORDER — MEDROXYPROGESTERONE ACETATE 150 MG/ML IM SUSP: 150.0000 mg | INTRAMUSCULAR | Status: DC | PRN

## 2017-01-05 MED ORDER — FENTANYL 2.5 MCG/ML BUPIVACAINE 1/10 % EPIDURAL INFUSION (WH - ANES): 14.0000 mL/h | INTRAMUSCULAR | Status: DC | PRN

## 2017-01-05 MED ORDER — EPHEDRINE 5 MG/ML INJ
10.0000 mg | INTRAVENOUS | Status: DC | PRN
  Filled 2017-01-05: qty 2

## 2017-01-05 MED ORDER — FLEET ENEMA 7-19 GM/118ML RE ENEM: 1.0000 | ENEMA | RECTAL | Status: DC | PRN

## 2017-01-05 MED ORDER — FENTANYL 2.5 MCG/ML BUPIVACAINE 1/10 % EPIDURAL INFUSION (WH - ANES)
INTRAMUSCULAR | Status: AC
  Filled 2017-01-05: qty 100

## 2017-01-05 MED ORDER — ONDANSETRON HCL 4 MG/2ML IJ SOLN: 4.0000 mg | INTRAMUSCULAR | Status: DC | PRN

## 2017-01-05 MED ORDER — SOD CITRATE-CITRIC ACID 500-334 MG/5ML PO SOLN: 30.0000 mL | ORAL | Status: DC | PRN

## 2017-01-05 MED ORDER — BENZOCAINE-MENTHOL 20-0.5 % EX AERO
1.0000 "application " | INHALATION_SPRAY | CUTANEOUS | Status: DC | PRN
  Administered 2017-01-07: 1 via TOPICAL
  Filled 2017-01-05 (×2): qty 56

## 2017-01-05 MED ORDER — ONDANSETRON HCL 4 MG/2ML IJ SOLN
4.0000 mg | Freq: Four times a day (QID) | INTRAMUSCULAR | Status: DC | PRN
  Administered 2017-01-05: 4 mg via INTRAVENOUS
  Filled 2017-01-05: qty 2

## 2017-01-05 MED ORDER — LACTATED RINGERS IV SOLN
INTRAVENOUS | Status: DC
  Administered 2017-01-05: 04:00:00 via INTRAVENOUS

## 2017-01-05 MED ORDER — PHENYLEPHRINE 40 MCG/ML (10ML) SYRINGE FOR IV PUSH (FOR BLOOD PRESSURE SUPPORT)
80.0000 ug | PREFILLED_SYRINGE | INTRAVENOUS | Status: DC | PRN
  Administered 2017-01-05: 80 ug via INTRAVENOUS
  Filled 2017-01-05: qty 5

## 2017-01-05 MED ORDER — PRENATAL MULTIVITAMIN CH
1.0000 | ORAL_TABLET | Freq: Every day | ORAL | Status: DC
  Administered 2017-01-06: 1 via ORAL
  Filled 2017-01-05: qty 1

## 2017-01-05 MED ORDER — ACETAMINOPHEN 325 MG PO TABS: 650.0000 mg | ORAL_TABLET | ORAL | Status: DC | PRN

## 2017-01-05 MED ORDER — PHENYLEPHRINE 40 MCG/ML (10ML) SYRINGE FOR IV PUSH (FOR BLOOD PRESSURE SUPPORT): 80.0000 ug | PREFILLED_SYRINGE | INTRAVENOUS | Status: DC | PRN

## 2017-01-05 MED ORDER — TERBUTALINE SULFATE 1 MG/ML IJ SOLN
0.2500 mg | Freq: Once | INTRAMUSCULAR | Status: DC | PRN
  Filled 2017-01-05: qty 1

## 2017-01-05 MED ORDER — IBUPROFEN 600 MG PO TABS
600.0000 mg | ORAL_TABLET | Freq: Four times a day (QID) | ORAL | Status: DC
  Administered 2017-01-05 – 2017-01-07 (×7): 600 mg via ORAL
  Filled 2017-01-05 (×7): qty 1

## 2017-01-05 MED ORDER — OXYTOCIN BOLUS FROM INFUSION
500.0000 mL | Freq: Once | INTRAVENOUS | Status: AC
  Administered 2017-01-05: 500 mL via INTRAVENOUS

## 2017-01-05 MED ORDER — LACTATED RINGERS IV SOLN
500.0000 mL | Freq: Once | INTRAVENOUS | Status: AC
  Administered 2017-01-05: 500 mL via INTRAVENOUS

## 2017-01-05 MED ORDER — OXYTOCIN 40 UNITS IN LACTATED RINGERS INFUSION - SIMPLE MED
2.5000 [IU]/h | INTRAVENOUS | Status: DC
  Filled 2017-01-05: qty 1000

## 2017-01-05 MED ORDER — LACTATED RINGERS IV SOLN: 500.0000 mL | Freq: Once | INTRAVENOUS | Status: DC

## 2017-01-05 MED ORDER — WITCH HAZEL-GLYCERIN EX PADS: 1.0000 "application " | MEDICATED_PAD | CUTANEOUS | Status: DC | PRN

## 2017-01-05 MED ORDER — BISACODYL 10 MG RE SUPP: 10.0000 mg | Freq: Every day | RECTAL | Status: DC | PRN

## 2017-01-05 NOTE — MAU Note (Signed)
Membranes stripped Thurs and cervix 1cm. Regular ctxs since 1800 with some bloody show. For TOLAC (first c/s for triplets)

## 2017-01-05 NOTE — H&P (Signed)
Desiree Santana is a 31 y.o. G 2 P 0203 at 3939 w 6 days presented in active labor this am. History of HELLP Syndrome with last pregnancy which was triplet pregnancy History of C Section in 2016 - and desires TOLAC. She has been appropriately counseled about the risks of maternal and fetal morbidity / mortality She is now status post epidural. OB History    Gravida Para Term Preterm AB Living   2 1 0 1 0 3   SAB TAB Ectopic Multiple Live Births   0 0 0 1 3     Past Medical History:  Diagnosis Date  . Endometriosis   . Endometriosis    stage 4  . History of HELLP syndrome, currently pregnant   . 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 Ovarian cyst   . Pregnancy induced hypertension   . Preterm labor    Past Surgical History:  Procedure Laterality Date  . BREAST ENHANCEMENT SURGERY    . BREAST SURGERY    . CESAREAN SECTION    . laproscopic endometreosis     Family History: family history includes Alcohol abuse in her father; Hearing loss in her maternal grandmother; Hypertension in her father, maternal grandfather, maternal grandmother, paternal grandfather, and paternal grandmother. Social History:  reports that she has never smoked. She has never used smokeless tobacco. She reports that she does not drink alcohol or use drugs.     Maternal Diabetes: No Genetic Screening: Normal Maternal Ultrasounds/Referrals: Normal Fetal Ultrasounds or other Referrals:  None Maternal Substance Abuse:  No Significant Maternal Medications:  None Significant Maternal Lab Results:  None Other Comments:  None  Review of Systems  All other systems reviewed and are negative.  Maternal Medical History:  Reason for admission: Contractions.   Fetal activity: Perceived fetal activity is normal.    Prenatal complications: no prenatal complications   Dilation: 3 Effacement (%): 90 Station: -2 Exam by:: S Moyer Blood pressure 105/65, pulse 92, temperature 98.6 F (37 C), temperature  source Oral, resp. rate 16, height 5' 8.5" (1.74 m), weight 77.6 kg (171 lb), last menstrual period 02/09/2016, SpO2 98 %.   Fetal Exam Fetal State Assessment: Category I - tracings are normal.     Physical Exam  Nursing note and vitals reviewed. Constitutional: She appears well-developed and well-nourished.  HENT:  Head: Normocephalic.  Eyes: Pupils are equal, round, and reactive to light.  Neck: Normal range of motion.  Cardiovascular: Normal rate and regular rhythm.   Respiratory: Effort normal and breath sounds normal.  GI: Soft.    Prenatal labs: ABO, Rh: --/--/A NEG, A NEG (07/21 0345) Antibody: NEG (07/21 0345) Rubella: Immune (12/13 0000) RPR: Nonreactive (12/13 0000)  HBsAg: Negative (12/13 0000)  HIV: Non-reactive (12/13 0000)  GBS: Negative (07/20 0000)   Assessment/Plan: IUP at 39 w 6 days Labor Pervious Cesarean Section Follow labor curve closely  Keven Osborn L 01/05/2017, 7:17 AM

## 2017-01-05 NOTE — Anesthesia Procedure Notes (Signed)
Epidural Patient location during procedure: OB Start time: 01/05/2017 4:10 AM End time: 01/05/2017 4:21 AM  Staffing Anesthesiologist: Arrin Ishler  Preanesthetic Checklist Completed: patient identified, site marked, surgical consent, pre-op evaluation, timeout performed, IV checked, risks and benefits discussed and monitors and equipment checked  Epidural Patient position: sitting Prep: site prepped and draped and DuraPrep Patient monitoring: continuous pulse ox and blood pressure Approach: midline Location: L4-L5 Injection technique: LOR air  Needle:  Needle type: Tuohy  Needle gauge: 17 G Needle length: 9 cm and 9 Needle insertion depth: 6 cm Catheter type: closed end flexible Catheter size: 19 Gauge Catheter at skin depth: 11 cm Test dose: negative  Assessment Events: blood not aspirated, injection not painful, no injection resistance, negative IV test and no paresthesia

## 2017-01-05 NOTE — Anesthesia Pain Management Evaluation Note (Signed)
  CRNA Pain Management Visit Note  Patient: Desiree Santana, 31 y.o., female  "Hello I am a member of the anesthesia team at [facility]. We have an anesthesia team available at all times to provide care throughout the hospital, including epidural management and anesthesia for C-section. I don't know your plan for the delivery whether it a natural birth, water birth, IV sedation, nitrous supplementation, doula or epidural, but we want to meet your pain goals."   1.Was your pain managed to your expectations on prior hospitalizations?   Yes, I had a C/S without labor.  2.What is your expectation for pain management during this hospitalization?     Epidural  3.How can we help you reach that goal? Maintain epidural.  Record the patient's initial score and the patient's pain goal.   Pain: 7 prior to epidural, now a 0.  Pain Goal: 7 or below. [facility] wants you to be able to say your pain was always managed very well.  Mila Pair 01/05/2017

## 2017-01-05 NOTE — Anesthesia Preprocedure Evaluation (Signed)
Anesthesia Evaluation  Patient identified by MRN, date of birth, ID band Patient awake    Reviewed: Allergy & Precautions, H&P , NPO status , Patient's Chart, lab work & pertinent test results, reviewed documented beta blocker date and time   Airway Mallampati: I  TM Distance: >3 FB Neck ROM: full    Dental no notable dental hx.    Pulmonary neg pulmonary ROS,    Pulmonary exam normal breath sounds clear to auscultation       Cardiovascular hypertension, negative cardio ROS Normal cardiovascular exam Rhythm:regular Rate:Normal     Neuro/Psych negative neurological ROS  negative psych ROS   GI/Hepatic negative GI ROS, Neg liver ROS,   Endo/Other  negative endocrine ROS  Renal/GU negative Renal ROS  negative genitourinary   Musculoskeletal   Abdominal   Peds  Hematology negative hematology ROS (+)   Anesthesia Other Findings   Reproductive/Obstetrics (+) Pregnancy                             Anesthesia Physical  Anesthesia Plan  ASA: II  Anesthesia Plan: Epidural   Post-op Pain Management:    Induction:   PONV Risk Score and Plan:   Airway Management Planned:   Additional Equipment:   Intra-op Plan:   Post-operative Plan:   Informed Consent: I have reviewed the patients History and Physical, chart, labs and discussed the procedure including the risks, benefits and alternatives for the proposed anesthesia with the patient or authorized representative who has indicated his/her understanding and acceptance.       Plan Discussed with:   Anesthesia Plan Comments:         Anesthesia Quick Evaluation  

## 2017-01-06 LAB — CBC
HCT: 32.8 % — ABNORMAL LOW (ref 36.0–46.0)
Hemoglobin: 11.1 g/dL — ABNORMAL LOW (ref 12.0–15.0)
MCH: 32.4 pg (ref 26.0–34.0)
MCHC: 33.8 g/dL (ref 30.0–36.0)
MCV: 95.6 fL (ref 78.0–100.0)
PLATELETS: 106 10*3/uL — AB (ref 150–400)
RBC: 3.43 MIL/uL — AB (ref 3.87–5.11)
RDW: 13.4 % (ref 11.5–15.5)
WBC: 16.3 10*3/uL — ABNORMAL HIGH (ref 4.0–10.5)

## 2017-01-06 MED ORDER — RHO D IMMUNE GLOBULIN 1500 UNIT/2ML IJ SOSY
300.0000 ug | PREFILLED_SYRINGE | Freq: Once | INTRAMUSCULAR | Status: AC
  Administered 2017-01-06: 300 ug via INTRAVENOUS
  Filled 2017-01-06: qty 2

## 2017-01-06 NOTE — Lactation Note (Addendum)
This note was copied from a baby's chart. Lactation Consultation Note  Patient Name: Girl Desiree FolksMelissa Santana NFAOZ'HToday's Date: 01/06/2017 - breast implants  Reason for consult: Initial assessment (encouraged mom to call with feeding cues )  Baby is 22 hours old and has been to the breast x6 10 -20 mins  Latch score = 7-8 . Voided x2 and stools x 2 . Per mom feels breast  Feeding is going well  And may need assist to latch on the right breast.  Baby presently sleeping and per mom last fed at 12N for 10 -15 mins.Desiree Santana.  LC discussed the importance of STS feedings, and breast feeding goal is at least 8 times a day.  If the baby hasn't shown signs of hunger and it has been a while , check diaper, change if needed  And STS , feed with feeding cues.  Discussed with mom potential engorgement challenges with implants and ways to prevent it.  Mom mentioned she feels she had engorgement with her triples and wasn't really aware of what it was.  Mother informed of post-discharge support and given phone number to the lactation department, including services for phone call assistance; out-patient appointments; and breastfeeding support group. List of other breastfeeding resources in the community given in the handout. Encouraged mother to call for problems or concerns related to breastfeeding.    Maternal Data Does the patient have breastfeeding experience prior to this delivery?: Yes  Feeding Feeding Type:  (baby last fed at 12N for 10 -15 mins per mom ) Length of feed: 10 min (per mom 10 -15 mins )  LATCH Score/Interventions                      Lactation Tools Discussed/Used WIC Program: No   Consult Status Consult Status: Follow-up Date: 01/06/17 Follow-up type: In-patient    Desiree Santana 01/06/2017, 2:45 PM

## 2017-01-06 NOTE — Lactation Note (Signed)
This note was copied from a baby's chart. Lactation Consultation Note  Patient Name: Desiree Santana ZOXWR'UToday's Date: 01/06/2017 Reason for consult: Follow-up assessment;Breast surgery  2nd LC visit, mom called for feeding assessment, and she had attempted, baby sleepy,  LC showed mom how to hand express with EBM yield, 2 ml .  LC mentioned to mom to keep at bedside until next feeding to be spoon fed. LC Instructed mom ,  Also reassured her that she could call for assistance to spoon fed and latch when the baby is awake.     Maternal Data Has patient been taught Hand Expression?: Yes (hand expressed 2 ml , baby to sleepy to feed ) Does the patient have breastfeeding experience prior to this delivery?: Yes  Feeding Feeding Type: Breast Fed Length of feed: 10 min (per mom 10 -15 mins )  LATCH Score/Interventions                      Lactation Tools Discussed/Used WIC Program: No   Consult Status Consult Status: Follow-up Date: 01/06/17 Follow-up type: In-patient    Matilde SprangMargaret Ann Bryn Perkin 01/06/2017, 3:51 PM

## 2017-01-06 NOTE — Anesthesia Postprocedure Evaluation (Signed)
Anesthesia Post Note  Patient: Desiree Santana  Procedure(s) Performed: * No procedures listed *     Patient location during evaluation: Mother Baby Anesthesia Type: Epidural Level of consciousness: awake and alert Pain management: pain level controlled Vital Signs Assessment: post-procedure vital signs reviewed and stable Respiratory status: spontaneous breathing, nonlabored ventilation and respiratory function stable Cardiovascular status: stable Postop Assessment: no headache, no backache, epidural receding and patient able to bend at knees Anesthetic complications: no    Last Vitals:  Vitals:   01/06/17 0000 01/06/17 0558  BP: 116/69 101/60  Pulse: 65 74  Resp: 18 18  Temp: 36.6 C 36.6 C    Last Pain:  Vitals:   01/06/17 1734  TempSrc:   PainSc: 3    Pain Goal: Patients Stated Pain Goal: 3 (01/06/17 1734)               Rica RecordsICKELTON,[name]

## 2017-01-06 NOTE — Progress Notes (Signed)
Patient doing well. No complaints  BP 101/60 (BP Location: Right Arm)   Pulse 74   Temp 97.8 F (36.6 C) (Oral)   Resp 18   Ht 5' 8.5" (1.74 m)   Wt 77.6 kg (171 lb)   LMP 02/09/2016 (Exact Date)   SpO2 99%   Breastfeeding? Unknown   BMI 25.62 kg/m  Abdomen is soft and non tender  Results for orders placed or performed during the hospital encounter of 01/05/17 (from the past 24 hour(s))  CBC     Status: Abnormal   Collection Time: 01/06/17  5:11 AM  Result Value Ref Range   WBC 16.3 (H) 4.0 - 10.5 K/uL   RBC 3.43 (L) 3.87 - 5.11 MIL/uL   Hemoglobin 11.1 (L) 12.0 - 15.0 g/dL   HCT 30.832.8 (L) 65.736.0 - 84.646.0 %   MCV 95.6 78.0 - 100.0 fL   MCH 32.4 26.0 - 34.0 pg   MCHC 33.8 30.0 - 36.0 g/dL   RDW 96.213.4 95.211.5 - 84.115.5 %   Platelets 106 (L) 150 - 400 K/uL  Rh IG workup (includes ABO/Rh)     Status: None   Collection Time: 01/06/17  5:11 AM  Result Value Ref Range   Gestational Age(Wks) 39.6    ABO/RH(D) A NEG    IMPRESSION: PPD # 1 doing well  PLAN: Routine care Repeat CBC to check platelet count - history of HELLP With first triplet pregnancy

## 2017-01-06 NOTE — Plan of Care (Signed)
Problem: Nutritional: Goal: Mothers verbalization of comfort with breastfeeding process will improve Outcome: Completed/Met Date Met: 01/06/17 Encouraged patient to call while breast feeding in order to assess latch. Patient experienced breast feeder and confident that feedings are going well so far.

## 2017-01-07 LAB — RH IG WORKUP (INCLUDES ABO/RH)
ABO/RH(D): A NEG
Fetal Screen: NEGATIVE
GESTATIONAL AGE(WKS): 39.6
Unit division: 0

## 2017-01-07 LAB — CBC
HEMATOCRIT: 33.2 % — AB (ref 36.0–46.0)
Hemoglobin: 11.4 g/dL — ABNORMAL LOW (ref 12.0–15.0)
MCH: 32.4 pg (ref 26.0–34.0)
MCHC: 34.3 g/dL (ref 30.0–36.0)
MCV: 94.3 fL (ref 78.0–100.0)
Platelets: 111 10*3/uL — ABNORMAL LOW (ref 150–400)
RBC: 3.52 MIL/uL — ABNORMAL LOW (ref 3.87–5.11)
RDW: 13.4 % (ref 11.5–15.5)
WBC: 13.2 10*3/uL — AB (ref 4.0–10.5)

## 2017-01-07 LAB — RPR: RPR: NONREACTIVE

## 2017-01-07 NOTE — Lactation Note (Signed)
This note was copied from a baby's chart. Lactation Consultation Note  Patient Name: Desiree Santana ZOXWR'UToday's Date: 01/07/2017 Reason for consult: Follow-up assessment;Infant weight loss (7% weight loss , breast implants )  Baby is 42 hours old and as LC walked in baby already latched , poor alignment, shallow latch. Baby  Released and LC assisted mom to obtain the proper alignment , and depth at the breast.  Multiple swallows noted, increased with breast compressions. Baby fed 25 mins and was content  Afterwards. Breast are filling, and LC mentioned to mom her milk is coming in and she acted surprised.  Mom had been given comfort gels and coconut oil for sore nipples. LC assessed breast tissue noted  Both nipples to be healthy, no breakdown, bruising, or positional strips.  LC showed mom how  compressible the areolas should be before latching.  And encouraged mom to use her EBM to her nipples liberally 1st.  Sore nipple and engorgement prevention and tx reviewed.  Per mom has  DEBP at home and a hand pump. Mother informed of post-discharge support and given phone number to the lactation department, including services for phone call assistance; out-patient appointments; and breastfeeding support group. List of other breastfeeding resources in the community given in the handout. Encouraged mother to call for problems or concerns related to breastfeeding.   Maternal Data Has patient been taught Hand Expression?: Yes  Feeding Feeding Type:  (baby already latched , deep , swallows ) Length of feed: 25 min (multiple swallows, increased w/ breast compressions )  LATCH Score/Interventions Latch: Grasps breast easily, tongue down, lips flanged, rhythmical sucking. (baby released shortly and re-latched ) Intervention(s): Adjust position;Assist with latch;Breast massage;Breast compression  Audible Swallowing: Spontaneous and intermittent  Type of Nipple: Everted at rest and after  stimulation  Comfort (Breast/Nipple): Filling, red/small blisters or bruises, mild/mod discomfort  Problem noted: Filling  Hold (Positioning): Assistance needed to correctly position infant at breast and maintain latch. Intervention(s): Breastfeeding basics reviewed;Support Pillows;Position options;Skin to skin  LATCH Score: 8  Lactation Tools Discussed/Used Tools: Pump Breast pump type: Double-Electric Breast Pump   Consult Status Consult Status: Complete Date: 01/07/17    Desiree Santana 01/07/2017, 10:12 AM

## 2017-01-07 NOTE — Discharge Summary (Signed)
Obstetric Discharge Summary Reason for Admission: onset of labor Prenatal Procedures: none Intrapartum Procedures: spontaneous vaginal delivery Postpartum Procedures: none Complications-Operative and Postpartum: low platlets, rising, improving. Avoid ibuprofen.  Hemoglobin  Date Value Ref Range Status  01/07/2017 11.4 (L) 12.0 - 15.0 g/dL Final   HCT  Date Value Ref Range Status  01/07/2017 33.2 (L) 36.0 - 46.0 % Final    Physical Exam:  General: alert, cooperative and appears stated age 31Lochia: appropriate Uterine Fundus: firm Incision: healing well, no significant drainage, no dehiscence, no significant erythema DVT Evaluation: No evidence of DVT seen on physical exam. Negative Homan's sign. No cords or calf tenderness. No significant calf/ankle edema.  Discharge Diagnoses: Term Pregnancy-delivered  Discharge Information: Date: 01/07/2017 Activity: pelvic rest Diet: routine Medications: None Condition: stable Instructions: refer to practice specific booklet Discharge to: home   Newborn Data: Live born female  Birth Weight: 8 lb 1.1 oz (3660 g) APGAR: 9, 9  Home with mother.  Ranae Pilalise Jennifer Siearra Amberg 01/07/2017, 8:33 AM

## 2017-01-12 ENCOUNTER — Inpatient Hospital Stay (HOSPITAL_COMMUNITY): Admission: RE | Admit: 2017-01-12 | Payer: BLUE CROSS/BLUE SHIELD | Source: Ambulatory Visit

## 2017-01-27 ENCOUNTER — Encounter (HOSPITAL_COMMUNITY): Payer: Self-pay | Admitting: Family Medicine

## 2017-01-27 ENCOUNTER — Emergency Department (HOSPITAL_COMMUNITY): Payer: BLUE CROSS/BLUE SHIELD

## 2017-01-27 ENCOUNTER — Ambulatory Visit (HOSPITAL_COMMUNITY)
Admission: EM | Admit: 2017-01-27 | Discharge: 2017-01-27 | Disposition: A | Discharge status: Nursing Home (Includes ICF) | Payer: BLUE CROSS/BLUE SHIELD | Source: Home / Self Care

## 2017-01-27 ENCOUNTER — Observation Stay (HOSPITAL_COMMUNITY)
Admission: EM | Admit: 2017-01-27 | Discharge: 2017-01-28 | Disposition: A | Discharge status: Home / Self Care | Payer: BLUE CROSS/BLUE SHIELD | Attending: Family Medicine | Admitting: Family Medicine

## 2017-01-27 ENCOUNTER — Encounter (HOSPITAL_COMMUNITY): Payer: Self-pay

## 2017-01-27 DIAGNOSIS — Z8759 Personal history of other complications of pregnancy, childbirth and the puerperium: Secondary | ICD-10-CM | POA: Diagnosis not present

## 2017-01-27 DIAGNOSIS — N809 Endometriosis, unspecified: Secondary | ICD-10-CM

## 2017-01-27 DIAGNOSIS — H02844 Edema of left upper eyelid: Secondary | ICD-10-CM | POA: Diagnosis not present

## 2017-01-27 DIAGNOSIS — R0789 Other chest pain: Secondary | ICD-10-CM | POA: Diagnosis not present

## 2017-01-27 DIAGNOSIS — R079 Chest pain, unspecified: Secondary | ICD-10-CM | POA: Diagnosis not present

## 2017-01-27 DIAGNOSIS — Z8249 Family history of ischemic heart disease and other diseases of the circulatory system: Secondary | ICD-10-CM

## 2017-01-27 DIAGNOSIS — H02841 Edema of right upper eyelid: Secondary | ICD-10-CM

## 2017-01-27 DIAGNOSIS — Z822 Family history of deafness and hearing loss: Secondary | ICD-10-CM

## 2017-01-27 DIAGNOSIS — R51 Headache: Secondary | ICD-10-CM | POA: Diagnosis not present

## 2017-01-27 DIAGNOSIS — O8689 Other specified puerperal infections: Secondary | ICD-10-CM | POA: Diagnosis not present

## 2017-01-27 DIAGNOSIS — B9789 Other viral agents as the cause of diseases classified elsewhere: Secondary | ICD-10-CM | POA: Diagnosis not present

## 2017-01-27 DIAGNOSIS — M549 Dorsalgia, unspecified: Secondary | ICD-10-CM | POA: Diagnosis not present

## 2017-01-27 DIAGNOSIS — Z811 Family history of alcohol abuse and dependence: Secondary | ICD-10-CM | POA: Diagnosis not present

## 2017-01-27 DIAGNOSIS — I301 Infective pericarditis: Secondary | ICD-10-CM

## 2017-01-27 DIAGNOSIS — R000 Tachycardia, unspecified: Secondary | ICD-10-CM

## 2017-01-27 DIAGNOSIS — Z862 Personal history of diseases of the blood and blood-forming organs and certain disorders involving the immune mechanism: Secondary | ICD-10-CM

## 2017-01-27 DIAGNOSIS — R0600 Dyspnea, unspecified: Secondary | ICD-10-CM | POA: Diagnosis not present

## 2017-01-27 DIAGNOSIS — R509 Fever, unspecified: Secondary | ICD-10-CM | POA: Diagnosis not present

## 2017-01-27 LAB — COMPREHENSIVE METABOLIC PANEL
ALT: 35 U/L (ref 14–54)
AST: 35 U/L (ref 15–41)
Albumin: 4 g/dL (ref 3.5–5.0)
Alkaline Phosphatase: 77 U/L (ref 38–126)
Anion gap: 10 (ref 5–15)
BUN: 12 mg/dL (ref 6–20)
CHLORIDE: 105 mmol/L (ref 101–111)
CO2: 24 mmol/L (ref 22–32)
CREATININE: 0.78 mg/dL (ref 0.44–1.00)
Calcium: 9 mg/dL (ref 8.9–10.3)
Glucose, Bld: 97 mg/dL (ref 65–99)
POTASSIUM: 3.5 mmol/L (ref 3.5–5.1)
SODIUM: 139 mmol/L (ref 135–145)
Total Bilirubin: 0.7 mg/dL (ref 0.3–1.2)
Total Protein: 7.3 g/dL (ref 6.5–8.1)

## 2017-01-27 LAB — CBC WITH DIFFERENTIAL/PLATELET
BASOS ABS: 0 10*3/uL (ref 0.0–0.1)
Basophils Relative: 0 %
Eosinophils Absolute: 0 10*3/uL (ref 0.0–0.7)
Eosinophils Relative: 0 %
HCT: 42.2 % (ref 36.0–46.0)
HEMOGLOBIN: 14.4 g/dL (ref 12.0–15.0)
LYMPHS ABS: 0.8 10*3/uL (ref 0.7–4.0)
Lymphocytes Relative: 11 %
MCH: 31.4 pg (ref 26.0–34.0)
MCHC: 34.1 g/dL (ref 30.0–36.0)
MCV: 92.1 fL (ref 78.0–100.0)
MONOS PCT: 7 %
Monocytes Absolute: 0.6 10*3/uL (ref 0.1–1.0)
NEUTROS PCT: 82 %
Neutro Abs: 6.5 10*3/uL (ref 1.7–7.7)
PLATELETS: 202 10*3/uL (ref 150–400)
RBC: 4.58 MIL/uL (ref 3.87–5.11)
RDW: 12.2 % (ref 11.5–15.5)
WBC: 7.9 10*3/uL (ref 4.0–10.5)

## 2017-01-27 LAB — URINALYSIS, ROUTINE W REFLEX MICROSCOPIC
Bilirubin Urine: NEGATIVE
Glucose, UA: NEGATIVE mg/dL
Hgb urine dipstick: NEGATIVE
KETONES UR: 80 mg/dL — AB
LEUKOCYTES UA: NEGATIVE
NITRITE: NEGATIVE
PROTEIN: NEGATIVE mg/dL
Specific Gravity, Urine: 1.021 (ref 1.005–1.030)
pH: 5 (ref 5.0–8.0)

## 2017-01-27 LAB — PROTIME-INR
INR: 1.01
Prothrombin Time: 13.3 seconds (ref 11.4–15.2)

## 2017-01-27 LAB — APTT: APTT: 33 s (ref 24–36)

## 2017-01-27 MED ORDER — SODIUM CHLORIDE 0.9 % IV BOLUS (SEPSIS)
1000.0000 mL | Freq: Once | INTRAVENOUS | Status: AC
  Administered 2017-01-27: 1000 mL via INTRAVENOUS

## 2017-01-27 MED ORDER — ACETAMINOPHEN 325 MG PO TABS
ORAL_TABLET | ORAL | Status: AC
  Filled 2017-01-27: qty 2

## 2017-01-27 MED ORDER — KETOROLAC TROMETHAMINE 30 MG/ML IJ SOLN: 30.0000 mg | Freq: Four times a day (QID) | INTRAMUSCULAR | Status: DC | PRN

## 2017-01-27 MED ORDER — KETOROLAC TROMETHAMINE 30 MG/ML IJ SOLN
30.0000 mg | Freq: Once | INTRAMUSCULAR | Status: AC
  Administered 2017-01-28: 30 mg via INTRAVENOUS
  Filled 2017-01-27: qty 1

## 2017-01-27 MED ORDER — IOPAMIDOL (ISOVUE-370) INJECTION 76%
INTRAVENOUS | Status: AC
  Administered 2017-01-27: 100 mL via INTRAVENOUS
  Filled 2017-01-27: qty 100

## 2017-01-27 MED ORDER — IBUPROFEN 200 MG PO TABS
400.0000 mg | ORAL_TABLET | Freq: Four times a day (QID) | ORAL | Status: DC | PRN
  Administered 2017-01-28: 400 mg via ORAL
  Filled 2017-01-27: qty 2

## 2017-01-27 MED ORDER — ONDANSETRON HCL 4 MG/2ML IJ SOLN: 4.0000 mg | Freq: Four times a day (QID) | INTRAMUSCULAR | Status: DC | PRN

## 2017-01-27 MED ORDER — ENOXAPARIN SODIUM 40 MG/0.4ML ~~LOC~~ SOLN: 40.0000 mg | SUBCUTANEOUS | Status: DC

## 2017-01-27 MED ORDER — ASPIRIN EC 81 MG PO TBEC
81.0000 mg | DELAYED_RELEASE_TABLET | Freq: Every day | ORAL | Status: DC
  Administered 2017-01-28 (×2): 81 mg via ORAL
  Filled 2017-01-27 (×2): qty 1

## 2017-01-27 MED ORDER — MORPHINE SULFATE (PF) 4 MG/ML IV SOLN
4.0000 mg | Freq: Once | INTRAVENOUS | Status: AC
  Administered 2017-01-27: 4 mg via INTRAVENOUS
  Filled 2017-01-27: qty 1

## 2017-01-27 MED ORDER — ONDANSETRON HCL 4 MG PO TABS: 4.0000 mg | ORAL_TABLET | Freq: Four times a day (QID) | ORAL | Status: DC | PRN

## 2017-01-27 MED ORDER — COLCHICINE 0.6 MG PO TABS
0.6000 mg | ORAL_TABLET | Freq: Two times a day (BID) | ORAL | Status: DC
Start: 2017-01-27 — End: 2017-01-27

## 2017-01-27 MED ORDER — SODIUM CHLORIDE 0.9 % IV SOLN
INTRAVENOUS | Status: DC
  Administered 2017-01-28: 04:00:00 via INTRAVENOUS

## 2017-01-27 MED ORDER — IOPAMIDOL (ISOVUE-370) INJECTION 76%
INTRAVENOUS | Status: AC
  Filled 2017-01-27: qty 100

## 2017-01-27 MED ORDER — ADULT MULTIVITAMIN W/MINERALS CH
1.0000 | ORAL_TABLET | Freq: Every day | ORAL | Status: DC
  Administered 2017-01-28: 1 via ORAL
  Filled 2017-01-27: qty 1

## 2017-01-27 MED ORDER — PIPERACILLIN-TAZOBACTAM 3.375 G IVPB 30 MIN
3.3750 g | Freq: Once | INTRAVENOUS | Status: AC
  Administered 2017-01-27: 3.375 g via INTRAVENOUS
  Filled 2017-01-27: qty 50

## 2017-01-27 MED ORDER — ACETAMINOPHEN 500 MG PO TABS: 1000.0000 mg | ORAL_TABLET | Freq: Four times a day (QID) | ORAL | Status: DC | PRN

## 2017-01-27 MED ORDER — ONDANSETRON HCL 4 MG/2ML IJ SOLN
4.0000 mg | Freq: Once | INTRAMUSCULAR | Status: AC
  Administered 2017-01-27: 4 mg via INTRAVENOUS
  Filled 2017-01-27: qty 2

## 2017-01-27 MED ORDER — VITAMIN D 1000 UNITS PO TABS
1000.0000 [IU] | ORAL_TABLET | Freq: Every day | ORAL | Status: DC
  Administered 2017-01-28: 1000 [IU] via ORAL
  Filled 2017-01-27: qty 1

## 2017-01-27 MED ORDER — ACETAMINOPHEN 325 MG PO TABS
650.0000 mg | ORAL_TABLET | Freq: Once | ORAL | Status: AC
  Administered 2017-01-27: 650 mg via ORAL

## 2017-01-27 MED ORDER — VANCOMYCIN HCL IN DEXTROSE 1-5 GM/200ML-% IV SOLN
1000.0000 mg | Freq: Once | INTRAVENOUS | Status: AC
  Administered 2017-01-27: 1000 mg via INTRAVENOUS
  Filled 2017-01-27: qty 200

## 2017-01-27 NOTE — ED Notes (Signed)
Patient transported to X-ray 

## 2017-01-27 NOTE — ED Notes (Signed)
Spoke with Desiree Caseyice, MD regarding need for stepdown bed request. re: active chest pain.

## 2017-01-27 NOTE — ED Provider Notes (Signed)
[facility] Provider Note   CSN: [id number] Arrival date & time: 01/27/17  1552     History   Chief Complaint Chief Complaint  Patient presents with  . Fever  . Chest Pain    HPI   Blood pressure 121/85, pulse (!) 127, temperature (!) 100.4 F (38 C), temperature source Oral, resp. rate (!) 22, SpO2 99 %, unknown if currently breastfeeding.  Via P Stoneham is a 31 y.o. female sent from urgent care for evaluation of fever, shortness of breath and bilateral upper back pain. Patient is 22 days postpartum, past medical history significant for help syndrome and pregnancy-induced hypertension. That she had some bilateral upper back pain starting 2 days ago she felt was positional from leaning over to nurse. She develops fever this afternoon and severe shortness of breath, anterior chest pain with lightheadedness. She denies cough, syncope, sick contacts, history of DVT/PE, recent mobilizations, calf pain, leg swelling. Patient is eating and drinking normally. Denies nausea or vomiting. Patient is reporting that she noticed a foul-smelling vaginal discharge onset this afternoon when she wiped herself. She is denying abdominal pain, nausea, vomiting, change in bowel or bladder habits. No recent tick bites or extensive time spent in the woods. On review of systems she endorses a global headache which is atypical for her. She notes that her 30-year-old son was febrile with an illness several days ago, fever transient now resolved, no other symptoms.  Past Medical History:  Diagnosis Date  . Endometriosis   . Endometriosis    stage 4  . History of HELLP syndrome, currently pregnant   . 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 Ovarian cyst   . Pregnancy induced hypertension   . Preterm labor     Patient Active Problem List   Diagnosis Date Noted  . Chest pain 01/27/2017  . Fever 01/27/2017  . Back pain 01/27/2017    Past Surgical History:  Procedure Laterality Date  .  BREAST ENHANCEMENT SURGERY    . BREAST SURGERY    . CESAREAN SECTION    . laproscopic endometreosis      OB History    Gravida Para Term Preterm AB Living   2 2 1 1  0 4   SAB TAB Ectopic Multiple Live Births   0 0 0 1 4       Home Medications    Prior to Admission medications   Medication Sig Start Date End Date Taking? Authorizing Provider  acetaminophen (TYLENOL) 500 MG tablet Take 1,000 mg by mouth every 6 (six) hours as needed for headache (pain).   Yes [provider]  Ascorbic Acid (VITAMIN C PO) Take 1 tablet by mouth daily.   Yes [provider]  Calcium-Magnesium-Zinc (CAL-MAG-ZINC PO) Take 1 tablet by mouth daily.   Yes [provider]  Cholecalciferol (VITAMIN D PO) Take 1 tablet by mouth daily.   Yes [provider]  Multiple Vitamin (MULTIVITAMIN WITH MINERALS) TABS tablet Take 1 tablet by mouth daily.   Yes [provider]  Omega-3 Fatty Acids (FISH OIL PO) Take 1 capsule by mouth daily.    Yes [provider]  Probiotic Product (PROBIOTIC PO) Take 1 tablet by mouth daily.   Yes [provider]    Family History Family History  Problem Relation Age of Onset  . Hypertension Father   . Alcohol abuse Father   . Hearing loss Maternal Grandmother        age related  . Hypertension  Maternal Grandmother   . Hypertension Maternal Grandfather   . Hypertension Paternal Grandmother   . Hypertension Paternal Grandfather     Social History Social History  Substance Use Topics  . Smoking status: Never Smoker  . Smokeless tobacco: Never Used  . Alcohol use No     Comment: social     Allergies   Patient has no known allergies.   Review of Systems Review of Systems  A complete review of systems was obtained and all systems are negative except as noted in the HPI and PMH.    Physical Exam Updated Vital Signs BP (!) 94/52   Pulse (!) 105   Temp 100 F (37.8 C) (Oral)   Resp 20   SpO2 98%    Physical Exam  Constitutional: She is oriented to person, place, and time. She appears well-developed and well-nourished. No distress.  HENT:  Head: Normocephalic and atraumatic.  Mouth/Throat: Oropharynx is clear and moist.  Eyes: Pupils are equal, round, and reactive to light. Conjunctivae and EOM are normal.  Neck: Normal range of motion. No JVD present. No tracheal deviation present.  FROM to C-spine. Pt can touch chin to chest without discomfort. No TTP of midline cervical spine.   Cardiovascular: Regular rhythm and intact distal pulses.   Tachycardic  Pulmonary/Chest: Effort normal and breath sounds normal. No stridor. No respiratory distress. She has no wheezes. She has no rales. She exhibits no tenderness.  Abdominal: Soft. She exhibits no distension and no mass. There is no tenderness. There is no rebound and no guarding. No hernia.  Genitourinary:  Genitourinary Comments: Frog-leg GU exam with well healing intravaginal tears. Scant amount of foul-smelling discharge.  Musculoskeletal: Normal range of motion. She exhibits no edema or tenderness.  No calf asymmetry, superficial collaterals, palpable cords, edema, Homans sign negative bilaterally.    Neurological: She is alert and oriented to person, place, and time.  Skin: Skin is warm. She is not diaphoretic.  Tearful  Psychiatric: She has a normal mood and affect.  Nursing note and vitals reviewed.    ED Treatments / Results  Labs (all labs ordered are listed, but only abnormal results are displayed) Labs Reviewed  URINALYSIS, ROUTINE W REFLEX MICROSCOPIC - Abnormal; Notable for the following:       Result Value   Ketones, ur 80 (*)    All other components within normal limits  CULTURE, BLOOD (ROUTINE X 2)  CULTURE, BLOOD (ROUTINE X 2)  COMPREHENSIVE METABOLIC PANEL  CBC WITH DIFFERENTIAL/PLATELET  PROTIME-INR  APTT  HIV ANTIBODY (ROUTINE TESTING)  BASIC METABOLIC PANEL  CBC  TROPONIN I  TROPONIN I  TROPONIN  I  SEDIMENTATION RATE  I-STAT CG4 LACTIC ACID, ED  I-STAT TROPONIN, ED  I-STAT CHEM 8, ED    EKG  EKG Interpretation  Date/Time:  Sunday January 27 2017 15:57:51 EDT Ventricular Rate:  127 PR Interval:  156 QRS Duration: 76 QT Interval:  306 QTC Calculation: 444 R Axis:   81 Text Interpretation:  Sinus tachycardia Right atrial enlargement T wave abnormality, consider inferior ischemia Abnormal ECG Nonspecific ST abnormality No old tracing to compare Confirmed by Dione Booze ([id number]) on 01/27/2017 10:54:53 PM       Radiology Dg Chest 2 View  Result Date: 01/27/2017 CLINICAL DATA:  Initial evaluation for acute chest pain. EXAM: CHEST  2 VIEW COMPARISON:  None. FINDINGS: The cardiac and mediastinal silhouettes are within normal limits. The lungs are normally inflated. No airspace consolidation, pleural effusion, or pulmonary edema  is identified. There is no pneumothorax. No acute osseous abnormality identified. IMPRESSION: No active cardiopulmonary disease. Electronically Signed   By: Rise Mu M.D.   On: 01/27/2017 17:35   Ct Angio Chest Pe W And/or Wo Contrast  Result Date: 01/27/2017 CLINICAL DATA:  Recent postpartum status with back pain. Chest pain and shortness of breath this morning. EXAM: CT ANGIOGRAPHY CHEST WITH CONTRAST TECHNIQUE: Multidetector CT imaging of the chest was performed using the standard protocol during bolus administration of intravenous contrast. Multiplanar CT image reconstructions and MIPs were obtained to evaluate the vascular anatomy. CONTRAST:  100 mL of Isovue 370 COMPARISON:  None. FINDINGS: Cardiovascular: Satisfactory opacification of the pulmonary arteries to the segmental level. No evidence of pulmonary embolism. Normal heart size. No pericardial effusion. Mediastinum/Nodes: No enlarged mediastinal, hilar, or axillary lymph nodes. Thyroid gland, trachea, and esophagus demonstrate no significant findings. Lungs/Pleura: Lungs are clear. No pleural  effusion or pneumothorax. Upper Abdomen: No acute abnormality. Musculoskeletal: No chest wall abnormality. No acute or significant osseous findings. Review of the MIP images confirms the above findings. IMPRESSION: No cause for the patient's symptoms identified. No pulmonary emboli identified. Electronically Signed   By: Gerome Sam III M.D   On: 01/27/2017 20:09   US Transvaginal Non-ob  Result Date: 01/27/2017 CLINICAL DATA:  Initial evaluation for acute fever. Recent vaginal delivery. EXAM: TRANSABDOMINAL AND TRANSVAGINAL ULTRASOUND OF PELVIS DOPPLER ULTRASOUND OF OVARIES TECHNIQUE: Both transabdominal and transvaginal ultrasound examinations of the pelvis were performed. Transabdominal technique was performed for global imaging of the pelvis including uterus, ovaries, adnexal regions, and pelvic cul-de-sac. It was necessary to proceed with endovaginal exam following the transabdominal exam to visualize the uterus and ovaries. Color and duplex Doppler ultrasound was utilized to evaluate blood flow to the ovaries. COMPARISON:  None. FINDINGS: Uterus Measurements: 10.7 x 4.2 x 6.7 cm. No fibroids or other mass visualized. Endometrium Thickness: 1.7 mm. Posterior acoustic shadowing may be due to presence of small foci of gas related to recent delivery. No focal lesion or significant vascularity. Right ovary Measurements: 2.1 x 0.9 x 1.3 cm. Normal appearance/no adnexal mass. Left ovary Measurements: 1.2 x 3.0 x 1.3 cm. Normal appearance/no adnexal mass. Pulsed Doppler evaluation of both ovaries demonstrates normal low-resistance arterial and venous waveforms. Other findings No abnormal free fluid. IMPRESSION: 1. Scattered foci of posterior shadowing extending from the endometrium, suspected to be due to small foci of gas within the endometrial canal related to recent delivery. Endometrium itself otherwise normal in appearance measuring 1.7 mm in thickness without associated vascularity. 2. Otherwise normal  pelvic ultrasound.  No evidence for torsion. Electronically Signed   By: Rise Mu M.D.   On: 01/27/2017 21:57   US Pelvis Complete  Result Date: 01/27/2017 CLINICAL DATA:  Initial evaluation for acute fever. Recent vaginal delivery. EXAM: TRANSABDOMINAL AND TRANSVAGINAL ULTRASOUND OF PELVIS DOPPLER ULTRASOUND OF OVARIES TECHNIQUE: Both transabdominal and transvaginal ultrasound examinations of the pelvis were performed. Transabdominal technique was performed for global imaging of the pelvis including uterus, ovaries, adnexal regions, and pelvic cul-de-sac. It was necessary to proceed with endovaginal exam following the transabdominal exam to visualize the uterus and ovaries. Color and duplex Doppler ultrasound was utilized to evaluate blood flow to the ovaries. COMPARISON:  None. FINDINGS: Uterus Measurements: 10.7 x 4.2 x 6.7 cm. No fibroids or other mass visualized. Endometrium Thickness: 1.7 mm. Posterior acoustic shadowing may be due to presence of small foci of gas related to recent delivery. No focal lesion or significant vascularity.  Right ovary Measurements: 2.1 x 0.9 x 1.3 cm. Normal appearance/no adnexal mass. Left ovary Measurements: 1.2 x 3.0 x 1.3 cm. Normal appearance/no adnexal mass. Pulsed Doppler evaluation of both ovaries demonstrates normal low-resistance arterial and venous waveforms. Other findings No abnormal free fluid. IMPRESSION: 1. Scattered foci of posterior shadowing extending from the endometrium, suspected to be due to small foci of gas within the endometrial canal related to recent delivery. Endometrium itself otherwise normal in appearance measuring 1.7 mm in thickness without associated vascularity. 2. Otherwise normal pelvic ultrasound.  No evidence for torsion. Electronically Signed   By: Rise MuBenjamin  McClintock M.D.   On: 01/27/2017 21:57   [place] Art/ven Flow Abd Pelv Doppler  Result Date: 01/27/2017 CLINICAL DATA:  Initial evaluation for acute fever. Recent vaginal  delivery. EXAM: TRANSABDOMINAL AND TRANSVAGINAL ULTRASOUND OF PELVIS DOPPLER ULTRASOUND OF OVARIES TECHNIQUE: Both transabdominal and transvaginal ultrasound examinations of the pelvis were performed. Transabdominal technique was performed for global imaging of the pelvis including uterus, ovaries, adnexal regions, and pelvic cul-de-sac. It was necessary to proceed with endovaginal exam following the transabdominal exam to visualize the uterus and ovaries. Color and duplex Doppler ultrasound was utilized to evaluate blood flow to the ovaries. COMPARISON:  None. FINDINGS: Uterus Measurements: 10.7 x 4.2 x 6.7 cm. No fibroids or other mass visualized. Endometrium Thickness: 1.7 mm. Posterior acoustic shadowing may be due to presence of small foci of gas related to recent delivery. No focal lesion or significant vascularity. Right ovary Measurements: 2.1 x 0.9 x 1.3 cm. Normal appearance/no adnexal mass. Left ovary Measurements: 1.2 x 3.0 x 1.3 cm. Normal appearance/no adnexal mass. Pulsed Doppler evaluation of both ovaries demonstrates normal low-resistance arterial and venous waveforms. Other findings No abnormal free fluid. IMPRESSION: 1. Scattered foci of posterior shadowing extending from the endometrium, suspected to be due to small foci of gas within the endometrial canal related to recent delivery. Endometrium itself otherwise normal in appearance measuring 1.7 mm in thickness without associated vascularity. 2. Otherwise normal pelvic ultrasound.  No evidence for torsion. Electronically Signed   By: Rise MuBenjamin  McClintock M.D.   On: 01/27/2017 21:57    Procedures Procedures (including critical care time)  Medications Ordered in ED Medications  acetaminophen (TYLENOL) 325 MG tablet (not administered)  iopamidol (ISOVUE-370) 76 % injection (not administered)  acetaminophen (TYLENOL) tablet 1,000 mg (not administered)  cholecalciferol (VITAMIN D) tablet 1,000 Units (not administered)  multivitamin with  minerals tablet 1 tablet (not administered)  enoxaparin (LOVENOX) injection 40 mg (not administered)  aspirin EC tablet 81 mg (81 mg Oral Given 01/28/17 0046)  ondansetron (ZOFRAN) tablet 4 mg (not administered)    Or  ondansetron (ZOFRAN) injection 4 mg (not administered)  0.9 %  sodium chloride infusion (not administered)  ibuprofen (ADVIL,MOTRIN) tablet 400 mg (not administered)  acetaminophen (TYLENOL) tablet 650 mg (650 mg Oral Given 01/27/17 1609)  sodium chloride 0.9 % bolus 1,000 mL (0 mLs Intravenous Stopped 01/27/17 1819)  vancomycin (VANCOCIN) IVPB 1000 mg/200 mL premix (0 mg Intravenous Stopped 01/27/17 2116)  piperacillin-tazobactam (ZOSYN) IVPB 3.375 g (0 g Intravenous Stopped 01/27/17 2015)  ondansetron (ZOFRAN) injection 4 mg (4 mg Intravenous Given 01/27/17 1847)  morphine 4 MG/ML injection 4 mg (4 mg Intravenous Given 01/27/17 1849)  iopamidol (ISOVUE-370) 76 % injection (100 mLs Intravenous Contrast Given 01/27/17 1912)  sodium chloride 0.9 % bolus 1,000 mL (0 mLs Intravenous Stopped 01/27/17 2245)  ketorolac (TORADOL) 30 MG/ML injection 30 mg (30 mg Intravenous Given 01/28/17 0047)  Initial Impression / Assessment and Plan / ED Course  I have reviewed the triage vital signs and the nursing notes.  Pertinent labs & imaging results that were available during my care of the patient were reviewed by me and considered in my medical decision making (see chart for details).     Vitals:   01/27/17 2300 01/27/17 2315 01/27/17 2330 01/28/17 0000  BP: 114/73 115/74 110/67 (!) 94/52  Pulse: (!) 105     Resp: (!) 23 (!) 22 18 20   Temp:      TempSrc:      SpO2: 98%       Medications  acetaminophen (TYLENOL) 325 MG tablet (not administered)  iopamidol (ISOVUE-370) 76 % injection (not administered)  acetaminophen (TYLENOL) tablet 1,000 mg (not administered)  cholecalciferol (VITAMIN D) tablet 1,000 Units (not administered)  multivitamin with minerals tablet 1 tablet (not  administered)  enoxaparin (LOVENOX) injection 40 mg (not administered)  aspirin EC tablet 81 mg (81 mg Oral Given 01/28/17 0046)  ondansetron (ZOFRAN) tablet 4 mg (not administered)    Or  ondansetron (ZOFRAN) injection 4 mg (not administered)  0.9 %  sodium chloride infusion (not administered)  ibuprofen (ADVIL,MOTRIN) tablet 400 mg (not administered)  acetaminophen (TYLENOL) tablet 650 mg (650 mg Oral Given 01/27/17 1609)  sodium chloride 0.9 % bolus 1,000 mL (0 mLs Intravenous Stopped 01/27/17 1819)  vancomycin (VANCOCIN) IVPB 1000 mg/200 mL premix (0 mg Intravenous Stopped 01/27/17 2116)  piperacillin-tazobactam (ZOSYN) IVPB 3.375 g (0 g Intravenous Stopped 01/27/17 2015)  ondansetron (ZOFRAN) injection 4 mg (4 mg Intravenous Given 01/27/17 1847)  morphine 4 MG/ML injection 4 mg (4 mg Intravenous Given 01/27/17 1849)  iopamidol (ISOVUE-370) 76 % injection (100 mLs Intravenous Contrast Given 01/27/17 1912)  sodium chloride 0.9 % bolus 1,000 mL (0 mLs Intravenous Stopped 01/27/17 2245)  ketorolac (TORADOL) 30 MG/ML injection 30 mg (30 mg Intravenous Given 01/28/17 0047)    Efraim Kaufmann P Jester is 31 y.o. female presenting with Bilateral upper back pain onset 2 days ago followed by fever, shortness of breath, pleuritic chest pain and lightheadedness. Patient febrile and tachycardic, nontoxic-appearing, no meningeal signs. Temperature prior to arrival over 101.  Blood work reassuring with normal lactic acid, no leukocytosis, urinalysis without signs of infection.  Patient bolused, vancomycin and Zosyn initiated, chest x-rays without infiltrate, PE study negative. Consider pericarditis and spinal epidural abscess as she did have the epidural x22 days ago.  Unassigned admission to internal medicine teaching service, discussed with Dr. Dimple Casey who accepts.  Final Clinical Impressions(s) / ED Diagnoses   Final diagnoses:  None    New Prescriptions New Prescriptions   No medications on file       Kaylyn Lim 01/28/17 0051    Abelino Derrick, MD 01/29/17 1642

## 2017-01-27 NOTE — ED Triage Notes (Signed)
Pt here complaint of chest pain, SOB. Pt also reports headache. Pt postpartum, birthdate [birth year]. Pt alert and oriented, skin warm and dry. Noted to be tachycardic in triage. Pt states she feels as though she cannot take a deep breath.

## 2017-01-27 NOTE — ED Provider Notes (Signed)
[facility]    CSN: [id number] Arrival date & time: 01/27/17  1434     History   Chief Complaint No chief complaint on file.   HPI Desiree Santana is a 31 y.o. female.   This is a 31 year old healthy woman who is recently postpartum and beginning 2 days ago developed some back pain. She thought might be positional from breast-feeding. When she woke up this morning she had significant chest pain and shortness of breath. She went to church and following this she felt hot and sweaty and took her temperature which was elevated. She is gotten progressively worse since noon.      Past Medical History:  Diagnosis Date  . Endometriosis   . Endometriosis    stage 4  . History of HELLP syndrome, currently pregnant   . 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 Ovarian cyst   . Pregnancy induced hypertension   . Preterm labor     Patient Active Problem List   Diagnosis Date Noted  . Normal labor 01/05/2017    Past Surgical History:  Procedure Laterality Date  . BREAST ENHANCEMENT SURGERY    . BREAST SURGERY    . CESAREAN SECTION    . laproscopic endometreosis      OB History    Gravida Para Term Preterm AB Living   2 2 1 1  0 4   SAB TAB Ectopic Multiple Live Births   0 0 0 1 4       Home Medications    Prior to Admission medications   Medication Sig Start Date End Date Taking? Authorizing Provider  Ascorbic Acid (VITAMIN C PO) Take 1 tablet by mouth daily.    [provider]  Multiple Vitamins-Calcium (ONE-A-DAY WOMENS PO) Take 1 tablet by mouth daily.    [provider]  Omega-3 Fatty Acids (FISH OIL PO) Take 1 tablet by mouth daily.    [provider]    Family History Family History  Problem Relation Age of Onset  . Hypertension Father   . Alcohol abuse Father   . Hearing loss Maternal Grandmother        age related  . Hypertension Maternal Grandmother   . Hypertension Maternal Grandfather   . Hypertension  Paternal Grandmother   . Hypertension Paternal Grandfather     Social History Social History  Substance Use Topics  . Smoking status: Never Smoker  . Smokeless tobacco: Never Used  . Alcohol use No     Comment: social     Allergies   Patient has no known allergies.   Review of Systems Review of Systems  Constitutional: Positive for appetite change, chills, diaphoresis and fever.  Respiratory: Positive for shortness of breath.   Cardiovascular: Positive for chest pain.  Gastrointestinal: Negative.   Musculoskeletal: Positive for myalgias.  Neurological: Positive for headaches.     Physical Exam Triage Vital Signs ED Triage Vitals [01/27/17 1534]  Enc Vitals Group     BP 132/81     Pulse Rate (!) 113     Resp 18     Temp (!) 101.3 F (38.5 C)     Temp Source Oral     SpO2 100 %     Weight      Height      Head Circumference      Peak Flow      Pain Score      Pain Loc      Pain Edu?  Excl. in GC?    No data found.   Updated Vital Signs BP 132/81 (BP Location: Left Arm)   Pulse (!) 113   Temp (!) 101.3 F (38.5 C) (Oral)   Resp 18   SpO2 100%    Physical Exam  Constitutional: She is oriented to person, place, and time. She appears well-developed and well-nourished. She appears distressed.  HENT:  Right Ear: External ear normal.  Left Ear: External ear normal.  Mouth/Throat: Oropharynx is clear and moist.  Eyes: Pupils are equal, round, and reactive to light. Conjunctivae are normal.  Patient is crying  Neck: Normal range of motion. Neck supple.  Cardiovascular: Regular rhythm.   Pulmonary/Chest: Effort normal and breath sounds normal.  Musculoskeletal: Normal range of motion.  Neurological: She is alert and oriented to person, place, and time.  Skin: Skin is warm and dry.  Nursing note and vitals reviewed.    UC Treatments / Results  Labs (all labs ordered are listed, but only abnormal results are displayed) Labs Reviewed - No data to  display  EKG  EKG Interpretation None       Radiology No results found.  Procedures Procedures (including critical care time)  Medications Ordered in UC Medications - No data to display   Initial Impression / Assessment and Plan / UC Course  I have reviewed the triage vital signs and the nursing notes.  Pertinent labs & imaging results that were available during my care of the patient were reviewed by me and considered in my medical decision making (see chart for details).     Final Clinical Impressions(s) / UC Diagnoses   Final diagnoses:  Other chest pain  Dyspnea, unspecified type   Patient with rather acute onset of shortness of breath and chest pain suggest that she may have a rapidly progressive pneumonia or pulmonary embolus.  New Prescriptions New Prescriptions   No medications on file  Directed to the emergency room immediately  Controlled Substance Prescriptions [place] Controlled Substance Registry consulted? Not Applicable   Elvina SidleLauenstein, Johnny Gorter, MD 01/27/17 1547

## 2017-01-27 NOTE — ED Notes (Signed)
ED Provider at bedside. 

## 2017-01-27 NOTE — H&P (Signed)
Date: 01/28/2017               Patient Name:  Desiree Santana MRN: [id number]  DOB: 1986/04/02 Age / Sex: 31 y.o., female   PCP: Patient, No Pcp Per         Medical Service: Internal Medicine Teaching Service         Attending Physician: Dr. Doneen Poisson, MD    First Contact: Dr. Rozann Lesches Pager: [phone]  Second Contact: Dr. Valentino Nose Pager: [phone]       After Hours (After 5p/  First Contact Pager: [phone]  weekends / holidays): Second Contact Pager: [phone]   Chief Complaint: chest pain, Headache, SOB   History of Present Illness: pt is a 31 yo female G2P1104 with PMH of HELLP syndrome, endometriosis,  Cesarean section, recent VBAC 22 days ago( no hellp syndrome however thrombocytopenia), presents with acute onset substernal chest pain beginning this morning, the pain is described as "an air bubble that won't pop" constant, radiating to the upper back concentrated around the thoracic spine but also in the upper border of bilateral scapula.  Pt mentions she had some pain in her back a few days ago that she felt was positional and she thought was from breast feeding.  Pain is worse with laying flat, taking deep breaths, not relieved by leaning forward or tylenol, her associated symptoms are SOB(more from not being able take deep breaths), headache, fever.  Pt denies cough, nausea/vom, diarrhea, no recent illnesses, tick bites or other insect bites, photofobia, however pt endorses her son had a fever at home and vomiting since wednesday.  Pt says the puffiness in her upper eyelids began at 5pm 8/12.  ED course: Pt was sent from urgent care arrived to the ED tachycardic, fever of 100.4, 100% on room air, rr 22, urinalysis neg, Chest X ray neg, CT angio chest neg, blood cultures drawn, CMP normal, CBC normal, PT/PTT normal, US pelvis/transvag normal,  Doppler of ovaries normal, ECG shows ST elevation in leads II, III, AVF with T wave inversions, MRI of cervical, thoracic  and lumbar spine ordered, one troponin ordered at 4pm result never entered, Lactic acid ordered not resulted, pts chest pain has remained stable, Pt was given vanc/zosyn and 2 1L NS boluses.      Meds:  Current Meds  Medication Sig  . acetaminophen (TYLENOL) 500 MG tablet Take 1,000 mg by mouth every 6 (six) hours as needed for headache (pain).  . Ascorbic Acid (VITAMIN C PO) Take 1 tablet by mouth daily.  . Calcium-Magnesium-Zinc (CAL-MAG-ZINC PO) Take 1 tablet by mouth daily.  . Cholecalciferol (VITAMIN D PO) Take 1 tablet by mouth daily.  . Multiple Vitamin (MULTIVITAMIN WITH MINERALS) TABS tablet Take 1 tablet by mouth daily.  . Omega-3 Fatty Acids (FISH OIL PO) Take 1 capsule by mouth daily.   . Probiotic Product (PROBIOTIC PO) Take 1 tablet by mouth daily.     Allergies: Allergies as of 01/27/2017  . (No Known Allergies)   Past Medical History:  Diagnosis Date  . Endometriosis   . Endometriosis    stage 4  . History of HELLP syndrome, currently pregnant   . 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 Ovarian cyst   . Pregnancy induced hypertension   . Preterm labor     Family History:  Family History  Problem Relation Age of Onset  . Hypertension Father   . Alcohol abuse Father   . Hearing  loss Maternal Grandmother        age related  . Hypertension Maternal Grandmother   . Hypertension Maternal Grandfather   . Hypertension Paternal Grandmother   . Hypertension Paternal Grandfather     Social History:  Social History   Social History  . Marital status: Married    Spouse name: N/A  . Number of children: N/A  . Years of education: N/A   Occupational History  . Not on file.   Social History Main Topics  . Smoking status: Never Smoker  . Smokeless tobacco: Never Used  . Alcohol use No     Comment: social  . Drug use: No  . Sexual activity: Not on file   Other Topics Concern  . Not on file   Social History Narrative  . No narrative on file     Review of Systems: A complete ROS was negative except as per HPI.   Physical Exam: Blood pressure (!) 94/52, pulse (!) 105, temperature 100 F (37.8 C), temperature source Oral, resp. rate 20, SpO2 98 %, unknown if currently breastfeeding. Physical Exam  Constitutional: She is oriented to person, place, and time. She appears well-developed and well-nourished.  HENT:  Head: Normocephalic and atraumatic.  Mouth/Throat: No oropharyngeal exudate.  Eyes: Pupils are equal, round, and reactive to light. EOM are normal. Right eye exhibits no discharge. Left eye exhibits no discharge. No scleral icterus. Right eye exhibits normal extraocular motion and no nystagmus. Left eye exhibits normal extraocular motion and no nystagmus.  Bilateral superior eyelid edema and redness, pt rubbing her eyelids during exam.  Conjunctiva not grossly injected  Neck: No JVD present. No tracheal deviation present.  Cardiovascular: Regular rhythm and normal heart sounds.   No extrasystoles are present. Tachycardia present.  PMI is not displaced.  Exam reveals no gallop, no friction rub and no decreased pulses.   Pulmonary/Chest: Breath sounds normal. No stridor. No respiratory distress. She has no wheezes.  Pt taking shallow breaths due to pain but takes a deep breath that is clear to auscultation bilaterally when instructed to do so  Abdominal: Soft. Bowel sounds are normal. She exhibits no distension. There is no tenderness.  Musculoskeletal: She exhibits no edema or deformity.  Neurological: She is alert and oriented to person, place, and time. No cranial nerve deficit.  Skin: Skin is warm and dry. No rash noted. She is not diaphoretic. No erythema. No pallor.  Psychiatric: She has a normal mood and affect. Her behavior is normal.    EKG: personally reviewed my interpretation is sinus tachycardia, T wave inversions in leads II, III, AVF with mild st elevation in same leads  CXR: personally reviewed my  interpretation is no acute, cardiopulmonary process  Assessment & Plan by Problem: Principal Problem:   Chest pain Active Problems:   Fever   Back pain  Pt is a 31 yo female 862P1104 with PMH of HELLP syndrome, endometriosis,  Cesarean section, recent VBAC 22 days ago, presents with acute onset substernal chest pain, headache beginning this morning,    Chest pain/fever/headache/back pain: Pain began this morning, worsened throughout the day pt seen in urgent care and sent to ED.  DDX: Viral Pericarditis, Viral myocarditis, ACS, vasospastic angina, Meningitis,  Viral pericarditis/myocarditis: pt has recent sick contact with her child, children carry viruses common to cause peri/myocarditis, pain is worse when lying flat, pain is pleuritic, pt has a low grade fever and mild tachycardia  -Treating with Toradol one time and PRN ibuprofen -Sed  rate ordered -ECHO complete ordered  ACS/Vasospastic angina: chest pain developed quickly, radiates to upper back, although ECG changes do not meet criteria for inferior STEMI it is unusual to see focal inferior leads ECG changes in a young woman with no prior cardiac history. Pt had diaphoresis before event, pain is substernal and radiates, first troponin  01/27/17 @2310  = <0.03, pt denies illicit drug use and has none in her history.  -continuous cardiac monitoring -Trend troponins, repeat ECG in am  -magnesium level  Meningitis/epidural abscess: less likely, pt without meningeal signs, headache is mild bitemporal pt more bothered by chest pain, no photofobia, no vomiting or nausea  -MRI of cervical, thoracic, lumbar spine already in process -Blood cultures taken -Vanc Zosyn X1 administered  Dispo: Admit patient to Observation with expected length of stay less than 2 midnights.  Signed: Angelita Ingles, MD 01/28/2017, 12:47 AM

## 2017-01-27 NOTE — ED Notes (Signed)
Results not crossing into chart for I-stat labs which were completed earlier. Initial I-stat lactic acid was 1.45 and initial I-stat troponin was negative. [facility]

## 2017-01-28 ENCOUNTER — Observation Stay (HOSPITAL_BASED_OUTPATIENT_CLINIC_OR_DEPARTMENT_OTHER): Payer: BLUE CROSS/BLUE SHIELD

## 2017-01-28 ENCOUNTER — Observation Stay (HOSPITAL_COMMUNITY): Payer: BLUE CROSS/BLUE SHIELD

## 2017-01-28 ENCOUNTER — Encounter (HOSPITAL_COMMUNITY): Payer: Self-pay | Admitting: Cardiology

## 2017-01-28 DIAGNOSIS — R079 Chest pain, unspecified: Secondary | ICD-10-CM | POA: Diagnosis not present

## 2017-01-28 DIAGNOSIS — R51 Headache: Secondary | ICD-10-CM | POA: Diagnosis not present

## 2017-01-28 DIAGNOSIS — I301 Infective pericarditis: Secondary | ICD-10-CM | POA: Diagnosis not present

## 2017-01-28 DIAGNOSIS — R509 Fever, unspecified: Secondary | ICD-10-CM

## 2017-01-28 DIAGNOSIS — M545 Low back pain: Secondary | ICD-10-CM

## 2017-01-28 DIAGNOSIS — M549 Dorsalgia, unspecified: Secondary | ICD-10-CM | POA: Diagnosis not present

## 2017-01-28 LAB — POCT I-STAT, CHEM 8
BUN: 16 mg/dL (ref 6–20)
CHLORIDE: 104 mmol/L (ref 101–111)
Calcium, Ion: 1.09 mmol/L — ABNORMAL LOW (ref 1.15–1.40)
Creatinine, Ser: 0.6 mg/dL (ref 0.44–1.00)
GLUCOSE: 92 mg/dL (ref 65–99)
HCT: 44 % (ref 36.0–46.0)
Hemoglobin: 15 g/dL (ref 12.0–15.0)
POTASSIUM: 3.8 mmol/L (ref 3.5–5.1)
Sodium: 140 mmol/L (ref 135–145)
TCO2: 24 mmol/L (ref 0–100)

## 2017-01-28 LAB — BASIC METABOLIC PANEL
Anion gap: 7 (ref 5–15)
BUN: 7 mg/dL (ref 6–20)
CALCIUM: 7.9 mg/dL — AB (ref 8.9–10.3)
CHLORIDE: 107 mmol/L (ref 101–111)
CO2: 24 mmol/L (ref 22–32)
CREATININE: 0.66 mg/dL (ref 0.44–1.00)
GFR calc non Af Amer: >60 mL/min (ref >60)
GLUCOSE: 98 mg/dL (ref 65–99)
Potassium: 3.3 mmol/L — ABNORMAL LOW (ref 3.5–5.1)
Sodium: 138 mmol/L (ref 135–145)

## 2017-01-28 LAB — SEDIMENTATION RATE: Sed Rate: 8 mm/hr (ref 0–22)

## 2017-01-28 LAB — ECHOCARDIOGRAM COMPLETE
Height: 68 in
WEIGHTICAEL: 2491.2 [oz_av]

## 2017-01-28 LAB — CBC
HCT: 34.8 % — ABNORMAL LOW (ref 36.0–46.0)
Hemoglobin: 11.4 g/dL — ABNORMAL LOW (ref 12.0–15.0)
MCH: 30.5 pg (ref 26.0–34.0)
MCHC: 32.8 g/dL (ref 30.0–36.0)
MCV: 93 fL (ref 78.0–100.0)
PLATELETS: 162 10*3/uL (ref 150–400)
RBC: 3.74 MIL/uL — ABNORMAL LOW (ref 3.87–5.11)
RDW: 12.4 % (ref 11.5–15.5)
WBC: 4.4 10*3/uL (ref 4.0–10.5)

## 2017-01-28 LAB — TROPONIN I
Troponin I: <0.03 ng/mL (ref <0.03)
Troponin I: <0.03 ng/mL (ref <0.03)

## 2017-01-28 LAB — MAGNESIUM: Magnesium: 1.9 mg/dL (ref 1.7–2.4)

## 2017-01-28 LAB — POCT I-STAT TROPONIN I: Troponin i, poc: 0.01 ng/mL (ref 0.00–0.08)

## 2017-01-28 LAB — HIV ANTIBODY (ROUTINE TESTING W REFLEX): HIV Screen 4th Generation wRfx: NONREACTIVE

## 2017-01-28 LAB — CG4 I-STAT (LACTIC ACID): Lactic Acid, Venous: 1.45 mmol/L (ref 0.5–1.9)

## 2017-01-28 MED ORDER — COLCHICINE 0.6 MG PO TABS: 0.6000 mg | ORAL_TABLET | Freq: Two times a day (BID) | ORAL | 0 refills | Status: DC

## 2017-01-28 MED ORDER — IBUPROFEN 600 MG PO TABS: 600.0000 mg | ORAL_TABLET | Freq: Four times a day (QID) | ORAL | 0 refills | Status: AC

## 2017-01-28 MED ORDER — IBUPROFEN 600 MG PO TABS: 600.0000 mg | ORAL_TABLET | Freq: Four times a day (QID) | ORAL | Status: DC

## 2017-01-28 NOTE — Discharge Summary (Signed)
Name: Desiree Santana MRN: [id number] DOB: [birth year] 30 y.o. PCP: Patient, No Pcp Per  Date of Admission: 01/27/2017  4:05 PM Date of Discharge: 01/28/2017 Attending Physician: No att. providers found  Discharge Diagnosis:  Principal Problem:   Acute viral pericarditis Active Problems:   Chest pain   Fever   Back pain  Discharge Medications: Allergies as of 01/28/2017   No Known Allergies     Medication List    TAKE these medications   acetaminophen 500 MG tablet Commonly known as:  TYLENOL Take 1,000 mg by mouth every 6 (six) hours as needed for headache (pain).   CAL-MAG-ZINC PO Take 1 tablet by mouth daily.   colchicine 0.6 MG tablet Take 1 tablet (0.6 mg total) by mouth 2 (two) times daily.   FISH OIL PO Take 1 capsule by mouth daily.   ibuprofen 600 MG tablet Commonly known as:  ADVIL,MOTRIN Take 1 tablet (600 mg total) by mouth every 6 (six) hours. Do this for 5 days. Resume as needed ibuprofen after this time.   multivitamin with minerals Tabs tablet Take 1 tablet by mouth daily.   PROBIOTIC PO Take 1 tablet by mouth daily.   VITAMIN C PO Take 1 tablet by mouth daily.   VITAMIN D PO Take 1 tablet by mouth daily.      Disposition and follow-up:   [name] was discharged from [facility] in Good condition.  At the hospital follow up visit please address:  1.  The patient presented with complaints of chest pain and was diagnosed with acute pericarditis, likely 2/2 viral infection given her constellation of symptoms which included fever, headache, and back pain. Please re-evaluate her symptoms of chest pain. She was discharged with a 5 day course of ibuprofen for symptom managment and 7 day course of colchicine to prevent recurrence.   2.  Labs / imaging needed at time of follow-up: None  3.  Pending labs/ test needing follow-up: Blood cultures taken on admission  Follow-up Appointments: Follow-up Information    General PCP. Schedule an appointment as soon as possible for a visit.   Why:  Please follow up with your OB or general PCP regarding resolution of your symptoms as an outpatient within 1 week of discahrge.          Hospital Course by problem list: Principal Problem:   Acute viral pericarditis Active Problems:   Chest pain   Fever   Back pain   Desiree Santana is a 31 yo with a PMH of endometriosis and a recent vaginal delivery (about 3 weeks prior to admission) who is presented to the ED with a 24 hour history of acute onset substernal chest pain, fevers, headaches, and low back pain. She was admitted to the internal medicine teaching service for management. The specific problems addressed during admission are as follows:  Viral pericarditis without myocarditis: The patient initially complained of a sharp pressure in her chest which she described as worse with laying flat and deep inspiration. This, along with fever and EKG changes which showed T wave inversion and ST elevation in some leads, was concerning for pericarditis. Cardiology was consulted for assessment. They agreed that while her EKG changes were not classic for pericarditis, that her symptoms were consistent with this diagnosis. An echocardiogram showed LVEF = 55-60% and no hypokinesis or diastolic dysfunction. There was no evidence of pericardial effusion. The patient's  troponin level x 2 were negative, suggesting an absence of myocarditis.  The patient's symptoms improved greatly this morning with use of NSAIDs during her hospitalization. She was discharged with NSAIDs (600 mg q6 hours, 5 days) and colchicine (0.6mg  BID, 7 days) for management of pericarditis and instructed to follow up with a PCP/OB-GYN as an outpatient.    Headache, back ache, fever concerning for meningitis/epidural abscess in setting of recent vaginal delivery: The patient was febrile to 101.3 on admission and there was concern for meningitis and/or an  epidural abscess, given the patient's history of vaginal birth about 3 weeks prior to presentation with epidural anesthesia. The patient did not have a physical exam concerning for meningitis and MRI of cervical, thoracic, and lumbar spine did not identify an evidence of acute infection or abnormality. The patient also had a transvaginal ultrasound to evaluate for retained products of conception which may cause fever, however no abnormalities were identified on this exam. The patient's headache, fever, and backache resolved with IV Toradol and PRN ibuprofen. We suspected a viral prodrome was likely the cause of her symptoms, as this fits with her overall clinical picture and diagnose of viral pericarditis.   Discharge Vitals:   BP 100/63   Pulse 76   Temp 98.4 F (36.9 C) (Oral)   Resp 17   Ht 5\' 8"  (1.727 m)   Wt 155 lb 11.2 oz (70.6 kg)   SpO2 96%   BMI 23.67 kg/m   Pertinent Labs, Studies, and Procedures:   BMP BMP Latest Ref Rng & Units 01/28/2017 01/27/2017 01/27/2017  Glucose 65 - 99 mg/dL 98 92 97  BUN 6 - 20 mg/dL 7 16 12   Creatinine 0.44 - 1.00 mg/dL 5.36 6.44 0.34  Sodium 135 - 145 mmol/L 138 140 139  Potassium 3.5 - 5.1 mmol/L 3.3(L) 3.8 3.5  Chloride 101 - 111 mmol/L 107 104 105  CO2 22 - 32 mmol/L 24 - 24  Calcium 8.9 - 10.3 mg/dL 7.9(L) - 9.0   CBC CBC Latest Ref Rng & Units 01/28/2017 01/27/2017 01/27/2017  WBC 4.0 - 10.5 K/uL 4.4 - 7.9  Hemoglobin 12.0 - 15.0 g/dL 11.4(L) 15.0 14.4  Hematocrit 36.0 - 46.0 % 34.8(L) 44.0 42.2  Platelets 150 - 400 K/uL 162 - 202   CT angio chest PE w/wo contrast: IMPRESSION: No cause for the patient's symptoms identified. No pulmonary emboli Identified.  US Pelvis complete: IMPRESSION: 1. Scattered foci of posterior shadowing extending from the endometrium, suspected to be due to small foci of gas within the endometrial canal related to recent delivery. Endometrium itself otherwise normal in appearance measuring 1.7 mm in thickness  without associated vascularity. 2. Otherwise normal pelvic ultrasound.  No evidence for torsion.  MRI cervical, thoracic, lumbar spine complete: IMPRESSION: 1. No evidence for acute infection or other abnormality within the cervical, thoracic, or lumbar spine. 2. Mild degenerative disc bulge at C5-6 without significant stenosis. 3. 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 4. Mild degenerative disc bulge at L4-5 with resultant mild bilateral L4 foraminal narrowing. No neural impingement.  Discharge Instructions: Discharge Instructions    Call MD for:  difficulty breathing, headache or visual disturbances    Complete by:  As directed    Call MD for:  persistant nausea and vomiting    Complete by:  As directed    Call MD for:  severe uncontrolled pain    Complete by:  As directed    Call MD for:  temperature >100.4    Complete by:  As directed  Diet - low sodium heart healthy    Complete by:  As directed    Discharge instructions    Complete by:  As directed    Please follow up with your OB-GYN/Primary care physician regarding your hospitalization and resolution of your symptoms. You were given medicine to help treat pericarditis (ibuprofen, 5 day course) and prevent recurrence of pericarditis (colchicine, 7 day course). Please take this medication with food, as it can aggravate an empty stomach. Please contact a physician if you have difficulty taking this medication.   Increase activity slowly    Complete by:  As directed       Signed: Rozann LeschesNedrud, Kaizen Ibsen, MD 01/28/2017, 3:05 PM   Pager: [phone]

## 2017-01-28 NOTE — Progress Notes (Addendum)
Subjective:  [name] was seen laying comfortably in bed this morning in no acute distress. She still endorses pain with laying down and deep inspiration, but hit has significantly improved with use of Tordal and ibuprofen. She also endorses a mild headache which improved with ibuprofen.   Objective:  Vital signs in last 24 hours: Vitals:   01/28/17 0357 01/28/17 0415 01/28/17 0444 01/28/17 0514  BP: (!) 92/56 95/76 103/67 100/63  Pulse: 88 84 80 76  Resp: 16 (!) 22 (!) 21 17  Temp: 98.7 F (37.1 C) 98.4 F (36.9 C)    TempSrc: Oral Oral    SpO2: 97% 96% 97% 96%  Weight:  155 lb 11.2 oz (70.6 kg)    Height:  5\' 8"  (1.727 m)     Physical Exam  Constitutional: She appears well-developed and well-nourished. No distress.  Cardiovascular: Normal rate and regular rhythm.  Exam reveals no friction rub.   No murmur heard. Pulmonary/Chest: Effort normal and breath sounds normal. No respiratory distress. She has no wheezes.  Abdominal: Soft. She exhibits no distension. There is no tenderness.  Musculoskeletal: She exhibits no edema (of bilateral lower extremities) or tenderness (of bilateral lower extremities).  Skin: Skin is warm and dry. Capillary refill takes less than 2 seconds. No rash noted. No erythema.   Assessment/Plan:  Principal Problem:   Chest pain Active Problems:   Fever   Back pain  Desiree Santana is a 31 yo with a PMH of endometriosis, infertility, a previous pregnancy complicated by HELLP syndrome with cesarean section and a recent vaginal delivery (about 3 weeks prior to admission) who is presenting with a 24 hour history of acute onset substernal chest pain, fevers, headaches, and low back pain. She had an extensive workup in the ED and was admitted to the internal medicine teaching service for management. The specific problems addressed during admission are as follows:  Viral pericarditis without myocarditis: The patient had negative troponinx2 on admission  and an EKG showed sinus tachycardia, inferior T-wave inversion, and ST segment changes in lateral lateral leads. He repeat EKG did not show any of these changes, but cardiology was consulted to assess for pericarditis given the patient's description of positional chest pain, exposure to a child with recent viral illness, and changes on EKG (although her changes were not consistent with diffuse PR depression seen in classic presentations of pericarditis). The patient's troponin levels were negative, indicating that pericarditis is not associated with myocarditis. The patient's symptoms improved greatly this morning with use of NSAIDs and we will continue this medication.  -Cardiology agreed that her symptoms sounded like pericarditis; recommended ibuprofen 600 mg q6 hours for 5 days and colchicine 0.6 mg BID for 7 days for treatment. -Echocardiogram showed LVEF = 55-60% and no hypokinesis or diastolic dysfunction.   Headache, back ache, fever concerning for meningitis/epidural abscess in setting of recent vaginal delivery: The patient was febrile to 101.3 on admission and there was concern for meningitis and/or an epidural abscess, given the patient's history of vaginal birth about 3 weeks prior to presentation with epidural anesthesia. The patient did not have an exam concerning for meningitis and MRI of cervical, thoracic, and lumbar spine did not identify an evidence of acute infection or abnormality. The patient also have a transvaginal ultrasound to evaluate for retained products of conception which may cause infection/fever, however no abnormalities were identified. The patient's headache, fever, and backache resolved with IV Toradol and PRN ibuprofen. There are no gross focal  deficits on exam. Since most of the worrisome things have been ruled out with the above studies, I suspect a viral prodrome which is likely connected to her pericarditis as an explanation for the constellation of her symptoms.    -Continue use of PRN ibuprofen for headache management  Dispo: Anticipated discharge in approximately 1 day.   Desiree Lesches, MD 01/28/2017, 1:50 PM Pager: [phone]  Internal Medicine Attending  Date: 01/28/2017  Patient name: Desiree Santana Medical record number: [id number] Date of birth: December 03, 1985 Age: 31 y.o. Gender: female  I saw and evaluated the patient. I reviewed the resident's note by Dr. Saunders Santana and I agree with the resident's findings and plans as documented in her progress note.  Please see my H&P dated 01/28/2017 and attached to Dr. Starr Santana H&P dated 01/27/2017 for specifics of my evaluation, assessment, and plan from earlier in the day.

## 2017-01-28 NOTE — Consult Note (Signed)
Cardiology Consultation:   Patient ID: Desiree Santana; [id number]; 10/03/1985   Admit date: 01/27/2017 Date of Consult: 01/28/2017  Primary Care Provider: Patient, No Pcp Per Primary Cardiologist: new   Patient Profile:   Desiree MustMelissa P Santana is a 31 y.o. female s/p vaginal delivery 01/05/17 who is being seen today for the evaluation of chest pain at the request of Dr Josem KaufmannKlima.  History of Present Illness:   [name] is a 31 y/o female, s/p vaginal delivery 01/05/17. Her pregnancy was complicated by HELLP syndrome, her Plts are 162 today. Yesterday she developed SSCP, worse with inspiration and worse when laying back. Her pain radiated to her back. She was noted to be febrile-100.4. In the ED chest CTA was negative for PE. There was no pericardial effusion on CT. Troponin negative x 2. Her initial EKG is unusual with sinus tachycardia 127 and inferior TWI and ST depression. This is not seen on her EKG this am. She does feel better this am, received Toradol yesterday and has been started on Motrin. She was almost flat in bed and pain free when I came in to examine her.    Past Medical History:  Diagnosis Date  . Endometriosis   . Endometriosis    stage 4  . History of HELLP syndrome, currently pregnant   . 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 Ovarian cyst   . Pregnancy induced hypertension   . Preterm labor     Past Surgical History:  Procedure Laterality Date  . BREAST ENHANCEMENT SURGERY    . BREAST SURGERY    . CESAREAN SECTION    . laproscopic endometreosis       Inpatient Medications: Scheduled Meds: . aspirin EC  81 mg Oral Daily  . cholecalciferol  1,000 Units Oral Daily  . enoxaparin (LOVENOX) injection  40 mg Subcutaneous Q24H  . multivitamin with minerals  1 tablet Oral Daily   Continuous Infusions: . sodium chloride 75 mL/hr at 01/28/17 0427   PRN Meds: acetaminophen, ibuprofen, ondansetron **OR** ondansetron (ZOFRAN) IV  Allergies:   No Known  Allergies  Social History:   Social History   Social History  . Marital status: Married    Spouse name: N/A  . Number of children: N/A  . Years of education: N/A   Occupational History  . Not on file.   Social History Main Topics  . Smoking status: Never Smoker  . Smokeless tobacco: Never Used  . Alcohol use No     Comment: social  . Drug use: No  . Sexual activity: Not on file   Other Topics Concern  . Not on file   Social History Narrative  . No narrative on file    Family History:    Family History  Problem Relation Age of Onset  . Hypertension Father   . Alcohol abuse Father   . Hearing loss Maternal Grandmother        age related  . Hypertension Maternal Grandmother   . Hypertension Maternal Grandfather   . Hypertension Paternal Grandmother   . Hypertension Paternal Grandfather      ROS:  Please see the history of present illness.  ROS  All other ROS reviewed and negative.     Physical Exam/Data:   Vitals:   01/28/17 0357 01/28/17 0415 01/28/17 0444 01/28/17 0514  BP: (!) 92/56 95/76 103/67 100/63  Pulse: 88 84 80 76  Resp: 16 (!) 22 (!) 21 17  Temp: 98.7 F (37.1 C)  98.4 F (36.9 C)    TempSrc: Oral Oral    SpO2: 97% 96% 97% 96%  Weight:  155 lb 11.2 oz (70.6 kg)    Height:  5\' 8"  (1.727 m)      Intake/Output Summary (Last 24 hours) at 01/28/17 0952 Last data filed at 01/28/17 0900  Gross per 24 hour  Intake             2365 ml  Output                0 ml  Net             2365 ml   Filed Weights   01/28/17 0415  Weight: 155 lb 11.2 oz (70.6 kg)   Body mass index is 23.67 kg/m.  General:  Well nourished, well developed, in no acute distress HEENT: normal Lymph: no adenopathy Neck: no JVD Endocrine:  No thryomegaly Vascular: No carotid bruits; FA pulses 2+ bilaterally without bruits  Cardiac:  normal S1, S2; RRR; soft rub along LSB when leaning forward Lungs:  clear to auscultation bilaterally, no wheezing, rhonchi or rales  Abd:  soft, nontender, no hepatomegaly  Ext: no edema Musculoskeletal:  No deformities, BUE and BLE strength normal and equal Skin: warm and dry  Neuro:  CNs 2-12 intact, no focal abnormalities noted Psych:  Normal affect   EKG:  The EKG was personally reviewed and demonstrates:  NSR, ST with inferior TWI and ST depression  Telemetry:  Telemetry was personally reviewed and demonstrates:  NSR  Relevant CV Studies: Echo ordered  Laboratory Data:  Chemistry Recent Labs Lab 01/27/17 1559 01/27/17 1626 01/28/17 0414  NA 139 140 138  K 3.5 3.8 3.3*  CL 105 104 107  CO2 24  --  24  GLUCOSE 97 92 98  BUN 12 16 7   CREATININE 0.78 0.60 0.66  CALCIUM 9.0  --  7.9*  GFRNONAA >60  --  >60  GFRAA >60  --  >60  ANIONGAP 10  --  7     Recent Labs Lab 01/27/17 1559  PROT 7.3  ALBUMIN 4.0  AST 35  ALT 35  ALKPHOS 77  BILITOT 0.7   Hematology Recent Labs Lab 01/27/17 1559 01/27/17 1626 01/28/17 0414  WBC 7.9  --  4.4  RBC 4.58  --  3.74*  HGB 14.4 15.0 11.4*  HCT 42.2 44.0 34.8*  MCV 92.1  --  93.0  MCH 31.4  --  30.5  MCHC 34.1  --  32.8  RDW 12.2  --  12.4  PLT 202  --  162   Cardiac Enzymes Recent Labs Lab 01/27/17 2310 01/28/17 0414  TROPONINI <0.03 <0.03    Recent Labs Lab 01/27/17 1612  TROPIPOC 0.01    BNPNo results for input(s): BNP, PROBNP in the last 168 hours.  DDimer No results for input(s): DDIMER in the last 168 hours.  Radiology/Studies:  Dg Chest 2 View  Result Date: 01/27/2017 CLINICAL DATA:  Initial evaluation for acute chest pain. EXAM: CHEST  2 VIEW COMPARISON:  None. FINDINGS: The cardiac and mediastinal silhouettes are within normal limits. The lungs are normally inflated. No airspace consolidation, pleural effusion, or pulmonary edema is identified. There is no pneumothorax. No acute osseous abnormality identified. IMPRESSION: No active cardiopulmonary disease. Electronically Signed   By: Rise Mu M.D.   On: 01/27/2017 17:35    Ct Angio Chest Pe W And/or Wo Contrast  Result Date: 01/27/2017 CLINICAL DATA:  Recent postpartum  status with back pain. Chest pain and shortness of breath this morning. EXAM: CT ANGIOGRAPHY CHEST WITH CONTRAST TECHNIQUE: Multidetector CT imaging of the chest was performed using the standard protocol during bolus administration of intravenous contrast. Multiplanar CT image reconstructions and MIPs were obtained to evaluate the vascular anatomy. CONTRAST:  100 mL of Isovue 370 COMPARISON:  None. FINDINGS: Cardiovascular: Satisfactory opacification of the pulmonary arteries to the segmental level. No evidence of pulmonary embolism. Normal heart size. No pericardial effusion. Mediastinum/Nodes: No enlarged mediastinal, hilar, or axillary lymph nodes. Thyroid gland, trachea, and esophagus demonstrate no significant findings. Lungs/Pleura: Lungs are clear. No pleural effusion or pneumothorax. Upper Abdomen: No acute abnormality. Musculoskeletal: No chest wall abnormality. No acute or significant osseous findings. Review of the MIP images confirms the above findings. IMPRESSION: No cause for the patient's symptoms identified. No pulmonary emboli identified. Electronically Signed   By: Gerome Sam III M.D   On: 01/27/2017 20:09   Mr Cervical Spine Wo Contrast  Result Date: 01/28/2017 CLINICAL DATA:  Initial evaluation for acute fever. EXAM: MRI CERVICAL, THORACIC AND LUMBAR SPINE WITHOUT CONTRAST TECHNIQUE: Multiplanar and multiecho pulse sequences of the cervical spine, to include the craniocervical junction and cervicothoracic junction, and thoracic and lumbar spine, were obtained without intravenous contrast. COMPARISON:  None. FINDINGS: MRI CERVICAL SPINE FINDINGS Alignment: Vertebral bodies normally aligned with preservation of the normal cervical lordosis. No listhesis. Vertebrae: Vertebral body heights maintained. No evidence for acute or chronic fracture. Signal intensity within the vertebral body  bone marrow is normal. No discrete or worrisome osseous lesions. No abnormal marrow edema. No findings to suggest acute infection. Cord: Signal intensity within the cervical spinal cord is normal. No epidural collection. Posterior Fossa, vertebral arteries, paraspinal tissues: Visualized brain and posterior fossa are normal. Craniocervical junction normal. Paraspinous and prevertebral soft tissues within normal limits. Normal intravascular flow voids present within the vertebral arteries bilaterally. Disc levels: C5-6: Diffuse degenerative disc bulge. No significant spinal stenosis. Mild right C6 foraminal stenosis. No significant left foraminal narrowing. No other significant degenerative changes within the cervical spine. MRI THORACIC SPINE FINDINGS Alignment: Vertebral bodies normally aligned with preservation of the normal thoracic kyphosis. No listhesis. Vertebrae: Vertebral body maintained. No evidence for acute or chronic fracture. Signal intensity within the vertebral body bone marrow is normal. No findings to suggest acute infection. Cord: Signal intensity within the thoracic spinal cord is normal. No epidural collection. Paraspinal and other soft tissues: Paraspinous soft tissues within normal limits. Partially visualized lungs are grossly clear. Visualized visceral structures are normal. Disc levels: No significant disc pathology seen within the thoracic spine. No canal or foraminal stenosis. MRI LUMBAR SPINE FINDINGS Segmentation: Normal segmentation. Lowest well-formed disc labeled the L5-S1 level. Alignment: Chronic bilateral pars defects at L5 with associated 4 mm spondylolisthesis. Vertebral bodies otherwise normally aligned with preservation of the normal lumbar lordosis. Vertebrae: Vertebral body heights are maintained. No evidence for acute or chronic fracture. Signal intensity within the vertebral body bone marrow is normal. No discrete or worrisome osseous lesions. No abnormal marrow edema. No  evidence for infection. Conus medullaris: Extends to the T12-L1 level and appears normal. No epidural collection. Paraspinal and other soft tissues: Paraspinous soft tissues within normal limits. Visualized visceral structures are normal. Disc levels: No significant degenerative changes are seen.  The L3-4 level. L4-5: Mild diffuse degenerative disc bulge with disc desiccation. No canal stenosis. Mild bilateral L4 foraminal narrowing. No neural impingement. L5-S1: Chronic bilateral pars defects with associated 4 mm spondylolisthesis. Associated  broad posterior pseudo disc bulge/disc uncovering. No significant canal stenosis. L5 foramina are somewhat elongated without significant foraminal narrowing. IMPRESSION: 1. No evidence for acute infection or other abnormality within the cervical, thoracic, or lumbar spine. 2. Mild degenerative disc bulge at C5-6 without significant stenosis. 3. 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 4. Mild degenerative disc bulge at L4-5 with resultant mild bilateral L4 foraminal narrowing. No neural impingement. Electronically Signed   By: Rise Mu M.D.   On: 01/28/2017 02:42   Mr Thoracic Spine Wo Contrast  Result Date: 01/28/2017 CLINICAL DATA:  Initial evaluation for acute fever. EXAM: MRI CERVICAL, THORACIC AND LUMBAR SPINE WITHOUT CONTRAST TECHNIQUE: Multiplanar and multiecho pulse sequences of the cervical spine, to include the craniocervical junction and cervicothoracic junction, and thoracic and lumbar spine, were obtained without intravenous contrast. COMPARISON:  None. FINDINGS: MRI CERVICAL SPINE FINDINGS Alignment: Vertebral bodies normally aligned with preservation of the normal cervical lordosis. No listhesis. Vertebrae: Vertebral body heights maintained. No evidence for acute or chronic fracture. Signal intensity within the vertebral body bone marrow is normal. No discrete or worrisome osseous lesions. No abnormal  marrow edema. No findings to suggest acute infection. Cord: Signal intensity within the cervical spinal cord is normal. No epidural collection. Posterior Fossa, vertebral arteries, paraspinal tissues: Visualized brain and posterior fossa are normal. Craniocervical junction normal. Paraspinous and prevertebral soft tissues within normal limits. Normal intravascular flow voids present within the vertebral arteries bilaterally. Disc levels: C5-6: Diffuse degenerative disc bulge. No significant spinal stenosis. Mild right C6 foraminal stenosis. No significant left foraminal narrowing. No other significant degenerative changes within the cervical spine. MRI THORACIC SPINE FINDINGS Alignment: Vertebral bodies normally aligned with preservation of the normal thoracic kyphosis. No listhesis. Vertebrae: Vertebral body maintained. No evidence for acute or chronic fracture. Signal intensity within the vertebral body bone marrow is normal. No findings to suggest acute infection. Cord: Signal intensity within the thoracic spinal cord is normal. No epidural collection. Paraspinal and other soft tissues: Paraspinous soft tissues within normal limits. Partially visualized lungs are grossly clear. Visualized visceral structures are normal. Disc levels: No significant disc pathology seen within the thoracic spine. No canal or foraminal stenosis. MRI LUMBAR SPINE FINDINGS Segmentation: Normal segmentation. Lowest well-formed disc labeled the L5-S1 level. Alignment: Chronic bilateral pars defects at L5 with associated 4 mm spondylolisthesis. Vertebral bodies otherwise normally aligned with preservation of the normal lumbar lordosis. Vertebrae: Vertebral body heights are maintained. No evidence for acute or chronic fracture. Signal intensity within the vertebral body bone marrow is normal. No discrete or worrisome osseous lesions. No abnormal marrow edema. No evidence for infection. Conus medullaris: Extends to the T12-L1 level and  appears normal. No epidural collection. Paraspinal and other soft tissues: Paraspinous soft tissues within normal limits. Visualized visceral structures are normal. Disc levels: No significant degenerative changes are seen.  The L3-4 level. L4-5: Mild diffuse degenerative disc bulge with disc desiccation. No canal stenosis. Mild bilateral L4 foraminal narrowing. No neural impingement. L5-S1: Chronic bilateral pars defects with associated 4 mm spondylolisthesis. Associated broad posterior pseudo disc bulge/disc uncovering. No significant canal stenosis. L5 foramina are somewhat elongated without significant foraminal narrowing. IMPRESSION: 1. No evidence for acute infection or other abnormality within the cervical, thoracic, or lumbar spine. 2. Mild degenerative disc bulge at C5-6 without significant stenosis. 3. 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 4. Mild degenerative disc bulge at L4-5 with resultant mild bilateral L4 foraminal narrowing. No  neural impingement. Electronically Signed   By: Rise Mu M.D.   On: 01/28/2017 02:42   Mr Lumbar Spine Wo Contrast  Result Date: 01/28/2017 CLINICAL DATA:  Initial evaluation for acute fever. EXAM: MRI CERVICAL, THORACIC AND LUMBAR SPINE WITHOUT CONTRAST TECHNIQUE: Multiplanar and multiecho pulse sequences of the cervical spine, to include the craniocervical junction and cervicothoracic junction, and thoracic and lumbar spine, were obtained without intravenous contrast. COMPARISON:  None. FINDINGS: MRI CERVICAL SPINE FINDINGS Alignment: Vertebral bodies normally aligned with preservation of the normal cervical lordosis. No listhesis. Vertebrae: Vertebral body heights maintained. No evidence for acute or chronic fracture. Signal intensity within the vertebral body bone marrow is normal. No discrete or worrisome osseous lesions. No abnormal marrow edema. No findings to suggest acute infection. Cord: Signal intensity  within the cervical spinal cord is normal. No epidural collection. Posterior Fossa, vertebral arteries, paraspinal tissues: Visualized brain and posterior fossa are normal. Craniocervical junction normal. Paraspinous and prevertebral soft tissues within normal limits. Normal intravascular flow voids present within the vertebral arteries bilaterally. Disc levels: C5-6: Diffuse degenerative disc bulge. No significant spinal stenosis. Mild right C6 foraminal stenosis. No significant left foraminal narrowing. No other significant degenerative changes within the cervical spine. MRI THORACIC SPINE FINDINGS Alignment: Vertebral bodies normally aligned with preservation of the normal thoracic kyphosis. No listhesis. Vertebrae: Vertebral body maintained. No evidence for acute or chronic fracture. Signal intensity within the vertebral body bone marrow is normal. No findings to suggest acute infection. Cord: Signal intensity within the thoracic spinal cord is normal. No epidural collection. Paraspinal and other soft tissues: Paraspinous soft tissues within normal limits. Partially visualized lungs are grossly clear. Visualized visceral structures are normal. Disc levels: No significant disc pathology seen within the thoracic spine. No canal or foraminal stenosis. MRI LUMBAR SPINE FINDINGS Segmentation: Normal segmentation. Lowest well-formed disc labeled the L5-S1 level. Alignment: Chronic bilateral pars defects at L5 with associated 4 mm spondylolisthesis. Vertebral bodies otherwise normally aligned with preservation of the normal lumbar lordosis. Vertebrae: Vertebral body heights are maintained. No evidence for acute or chronic fracture. Signal intensity within the vertebral body bone marrow is normal. No discrete or worrisome osseous lesions. No abnormal marrow edema. No evidence for infection. Conus medullaris: Extends to the T12-L1 level and appears normal. No epidural collection. Paraspinal and other soft tissues:  Paraspinous soft tissues within normal limits. Visualized visceral structures are normal. Disc levels: No significant degenerative changes are seen.  The L3-4 level. L4-5: Mild diffuse degenerative disc bulge with disc desiccation. No canal stenosis. Mild bilateral L4 foraminal narrowing. No neural impingement. L5-S1: Chronic bilateral pars defects with associated 4 mm spondylolisthesis. Associated broad posterior pseudo disc bulge/disc uncovering. No significant canal stenosis. L5 foramina are somewhat elongated without significant foraminal narrowing. IMPRESSION: 1. No evidence for acute infection or other abnormality within the cervical, thoracic, or lumbar spine. 2. Mild degenerative disc bulge at C5-6 without significant stenosis. 3. 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 4. Mild degenerative disc bulge at L4-5 with resultant mild bilateral L4 foraminal narrowing. No neural impingement. Electronically Signed   By: Rise Mu M.D.   On: 01/28/2017 02:42   US Transvaginal Non-ob  Result Date: 01/27/2017 CLINICAL DATA:  Initial evaluation for acute fever. Recent vaginal delivery. EXAM: TRANSABDOMINAL AND TRANSVAGINAL ULTRASOUND OF PELVIS DOPPLER ULTRASOUND OF OVARIES TECHNIQUE: Both transabdominal and transvaginal ultrasound examinations of the pelvis were performed. Transabdominal technique was performed for global imaging of the pelvis including uterus, ovaries, adnexal  regions, and pelvic cul-de-sac. It was necessary to proceed with endovaginal exam following the transabdominal exam to visualize the uterus and ovaries. Color and duplex Doppler ultrasound was utilized to evaluate blood flow to the ovaries. COMPARISON:  None. FINDINGS: Uterus Measurements: 10.7 x 4.2 x 6.7 cm. No fibroids or other mass visualized. Endometrium Thickness: 1.7 mm. Posterior acoustic shadowing may be due to presence of small foci of gas related to recent delivery. No focal lesion  or significant vascularity. Right ovary Measurements: 2.1 x 0.9 x 1.3 cm. Normal appearance/no adnexal mass. Left ovary Measurements: 1.2 x 3.0 x 1.3 cm. Normal appearance/no adnexal mass. Pulsed Doppler evaluation of both ovaries demonstrates normal low-resistance arterial and venous waveforms. Other findings No abnormal free fluid. IMPRESSION: 1. Scattered foci of posterior shadowing extending from the endometrium, suspected to be due to small foci of gas within the endometrial canal related to recent delivery. Endometrium itself otherwise normal in appearance measuring 1.7 mm in thickness without associated vascularity. 2. Otherwise normal pelvic ultrasound.  No evidence for torsion. Electronically Signed   By: Rise Mu M.D.   On: 01/27/2017 21:57   US Pelvis Complete  Result Date: 01/27/2017 CLINICAL DATA:  Initial evaluation for acute fever. Recent vaginal delivery. EXAM: TRANSABDOMINAL AND TRANSVAGINAL ULTRASOUND OF PELVIS DOPPLER ULTRASOUND OF OVARIES TECHNIQUE: Both transabdominal and transvaginal ultrasound examinations of the pelvis were performed. Transabdominal technique was performed for global imaging of the pelvis including uterus, ovaries, adnexal regions, and pelvic cul-de-sac. It was necessary to proceed with endovaginal exam following the transabdominal exam to visualize the uterus and ovaries. Color and duplex Doppler ultrasound was utilized to evaluate blood flow to the ovaries. COMPARISON:  None. FINDINGS: Uterus Measurements: 10.7 x 4.2 x 6.7 cm. No fibroids or other mass visualized. Endometrium Thickness: 1.7 mm. Posterior acoustic shadowing may be due to presence of small foci of gas related to recent delivery. No focal lesion or significant vascularity. Right ovary Measurements: 2.1 x 0.9 x 1.3 cm. Normal appearance/no adnexal mass. Left ovary Measurements: 1.2 x 3.0 x 1.3 cm. Normal appearance/no adnexal mass. Pulsed Doppler evaluation of both ovaries demonstrates normal  low-resistance arterial and venous waveforms. Other findings No abnormal free fluid. IMPRESSION: 1. Scattered foci of posterior shadowing extending from the endometrium, suspected to be due to small foci of gas within the endometrial canal related to recent delivery. Endometrium itself otherwise normal in appearance measuring 1.7 mm in thickness without associated vascularity. 2. Otherwise normal pelvic ultrasound.  No evidence for torsion. Electronically Signed   By: Rise Mu M.D.   On: 01/27/2017 21:57   [place] Art/ven Flow Abd Pelv Doppler  Result Date: 01/27/2017 CLINICAL DATA:  Initial evaluation for acute fever. Recent vaginal delivery. EXAM: TRANSABDOMINAL AND TRANSVAGINAL ULTRASOUND OF PELVIS DOPPLER ULTRASOUND OF OVARIES TECHNIQUE: Both transabdominal and transvaginal ultrasound examinations of the pelvis were performed. Transabdominal technique was performed for global imaging of the pelvis including uterus, ovaries, adnexal regions, and pelvic cul-de-sac. It was necessary to proceed with endovaginal exam following the transabdominal exam to visualize the uterus and ovaries. Color and duplex Doppler ultrasound was utilized to evaluate blood flow to the ovaries. COMPARISON:  None. FINDINGS: Uterus Measurements: 10.7 x 4.2 x 6.7 cm. No fibroids or other mass visualized. Endometrium Thickness: 1.7 mm. Posterior acoustic shadowing may be due to presence of small foci of gas related to recent delivery. No focal lesion or significant vascularity. Right ovary Measurements: 2.1 x 0.9 x 1.3 cm. Normal appearance/no adnexal mass. Left ovary Measurements:  1.2 x 3.0 x 1.3 cm. Normal appearance/no adnexal mass. Pulsed Doppler evaluation of both ovaries demonstrates normal low-resistance arterial and venous waveforms. Other findings No abnormal free fluid. IMPRESSION: 1. Scattered foci of posterior shadowing extending from the endometrium, suspected to be due to small foci of gas within the endometrial canal  related to recent delivery. Endometrium itself otherwise normal in appearance measuring 1.7 mm in thickness without associated vascularity. 2. Otherwise normal pelvic ultrasound.  No evidence for torsion. Electronically Signed   By: Rise Mu M.D.   On: 01/27/2017 21:57    Assessment and Plan:   Chest pain- Suspect this is from pericarditis. Probably viral but BCs pending  Post Partum- Vaginal delivery 01/05/17. pregnancy complicated by HELLP syndrome. Plts normal on admission.  Endometriosis- Per primary service.  Plan: Awaiting echo. MD to see.    Signed, Corine Shelter, PA-C  01/28/2017 9:52 AM   History and all data above reviewed.  Patient examined.  I agree with the findings as above.  The patient had chest pain and back pain that was sharp and worse with inspiration and lying flat.  No PE on exam.  She feels better. Otherwise no past cardiac history.  She is active and has had had no past cardiac complaints.  The patient exam reveals COR:RRR, no rub  ,  Lungs: Clear  ,  Abd: Positive bowel sounds, no rebound no guarding, Ext No edema  .  All available labs, radiology testing, previous records reviewed. Agree with documented assessment and plan. CHEST PAIN:  I do suspect based on the description that she had pericarditis although the exam and EKG don't support this.  There is no evidence of myocarditis.  I would suggest a limited course of Ibuprofen.  (600 mg PO tid x 5 days) and Colchicine bid x 1 week.  If she has any further symptoms we could extend this.  Echo is pending.     Fayrene Fearing Emilie Carp  10:59 AM  01/28/2017

## 2017-01-28 NOTE — Progress Notes (Signed)
  Echocardiogram 2D Echocardiogram has been performed.  Arvil ChacoFoster, Tausha Milhoan 01/28/2017, 12:30 PM

## 2017-01-28 NOTE — ED Notes (Signed)
Patient to MRI.

## 2017-02-01 LAB — CULTURE, BLOOD (ROUTINE X 2)
CULTURE: NO GROWTH
SPECIAL REQUESTS: ADEQUATE

## 2017-02-04 LAB — CULTURE, BLOOD (ROUTINE X 2): Special Requests: ADEQUATE

## 2017-02-25 ENCOUNTER — Ambulatory Visit (INDEPENDENT_AMBULATORY_CARE_PROVIDER_SITE_OTHER): Payer: BLUE CROSS/BLUE SHIELD | Admitting: Physician Assistant

## 2017-02-25 ENCOUNTER — Encounter: Payer: Self-pay | Admitting: Physician Assistant

## 2017-02-25 VITALS — BP 114/70 | HR 90 | Ht 68.0 in | Wt 151.6 lb

## 2017-02-25 DIAGNOSIS — I301 Infective pericarditis: Secondary | ICD-10-CM | POA: Diagnosis not present

## 2017-02-25 NOTE — Progress Notes (Signed)
Cardiology Office Note   Date:  02/25/2017   ID:  Desiree Santana, DOB 06/09/1986, MRN [id number]  PCP:  Patient, No Pcp Per  Cardiologist:  Dr. Antoine PocheHochrein, in-hospital 01/28/2017  Barrett, Bjorn Loserhonda, PA-C   History of Present Illness: Desiree Santana is a 31 y.o. female with a history of vaginal delivery 01/05/2017, complicated by HELLP syndrome, platelets 162 on 01/28/2017.  Admitted 08/12-0 01/28/2017 for chest pain, diagnosed with acute viral pericarditis, EF normal on echo, patient started on ibuprofen 600 mg 3 times a day 5 days and colchicine 0.6 mg twice a day 1 week  Desiree Santana presents for cardiology follow up.  Her daughter had an entero virus at 27 days, was in the hospital 4 days, but thankfully no meningitis. Her triplets and husband also got the virus.   It is highly likely that the pericarditis was viral in origin.  She has done very well since the hospital stay. She has completed the medications and had no more chest pain.   Her energy level is good. She is ready to get back to exercising, and wonders if she can do the intense workout she was doing before.  She is very busy taking care of her 31-year-old triplets and her 6031-month-old, but feels well and does not feel that she has any reduced energy levels.   Past Medical History:  Diagnosis Date  . Endometriosis   . Endometriosis    stage 4  . History of HELLP syndrome, currently pregnant   . 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 Ovarian cyst   . Pregnancy induced hypertension   . Preterm labor     Past Surgical History:  Procedure Laterality Date  . BREAST ENHANCEMENT SURGERY    . BREAST SURGERY    . CESAREAN SECTION    . laproscopic endometreosis      Current Outpatient Prescriptions  Medication Sig Dispense Refill  . acetaminophen (TYLENOL) 500 MG tablet Take 1,000 mg by mouth every 6 (six) hours as needed for headache (pain).    . Ascorbic Acid (VITAMIN C PO) Take 1 tablet by  mouth daily.    . Calcium-Magnesium-Zinc (CAL-MAG-ZINC PO) Take 1 tablet by mouth daily.    . Cholecalciferol (VITAMIN D PO) Take 1 tablet by mouth daily.    . Multiple Vitamin (MULTIVITAMIN WITH MINERALS) TABS tablet Take 1 tablet by mouth daily.    . Omega-3 Fatty Acids (FISH OIL PO) Take 1 capsule by mouth daily.     . Probiotic Product (PROBIOTIC PO) Take 1 tablet by mouth daily.     No current facility-administered medications for this visit.     Allergies:   Patient has no known allergies.    Social History:  The patient  reports that she has never smoked. She has never used smokeless tobacco. She reports that she does not drink alcohol or use drugs.   Family History:  The patient's family history includes Alcohol abuse in her father; Hearing loss in her maternal grandmother; Hypertension in her father, maternal grandfather, maternal grandmother, paternal grandfather, and paternal grandmother.    ROS:  Please see the history of present illness. All other systems are reviewed and negative.    PHYSICAL EXAM: VS:  BP 114/70   Pulse 90   Ht 5\' 8"  (1.727 m)   Wt 151 lb 9.6 oz (68.8 kg)   SpO2 98%   BMI 23.05 kg/m  , BMI Body mass index is 23.05 kg/m. GEN:  Well nourished, well developed, female in no acute distress  HEENT: normal for age  Neck: no JVD, no carotid bruit, no masses Cardiac: RRR; no murmur, no rubs, or gallops Respiratory:  clear to auscultation bilaterally, normal work of breathing GI: soft, nontender, nondistended, + BS MS: no deformity or atrophy; no edema; distal pulses are 2+ in all 4 extremities   Skin: warm and dry, no rash Neuro:  Strength and sensation are intact Psych: euthymic mood, full affect   EKG:  EKG is not ordered today.  ECHO: 01/28/2017 - Left ventricle: The cavity size was normal. Wall thickness was   normal. Systolic function was normal. The estimated ejection   fraction was in the range of 55% to 60%. Wall motion was normal;   there  were no regional wall motion abnormalities. Left   ventricular diastolic function parameters were normal. Impressions: - Normal study.   Recent Labs: 01/27/2017: ALT 35 01/28/2017: BUN 7; Creatinine, Ser 0.66; Hemoglobin 11.4; Magnesium 1.9; Platelets 162; Potassium 3.3; Sodium 138    Lipid Panel No results found for: CHOL, TRIG, HDL, CHOLHDL, VLDL, LDLCALC, LDLDIRECT   Wt Readings from Last 3 Encounters:  02/25/17 151 lb 9.6 oz (68.8 kg)  01/28/17 155 lb 11.2 oz (70.6 kg)  01/05/17 171 lb (77.6 kg)     Other studies Reviewed: Additional studies/ records that were reviewed today include: Hospital records and testing.  ASSESSMENT AND PLAN:  1.  Viral pericarditis: A virus gradually affected her whole family, they got sick one after the other. That is the period of time when she had her chest pain and was diagnosed with pericarditis. It was most likely viral in origin. She got better with a short course of ibuprofen and colchicine. Her EF was normal on echo.  No further workup is indicated. She is encouraged to contact us if she gets the pericarditis again, but it is hoped she will not. It is okay to increase her activity gradually, but she is encouraged not to jump right into it all at once.   Current medicines are reviewed at length with the patient today.  The patient does not have concerns regarding medicines.  The following changes have been made:  no change  Labs/ tests ordered today include:  No orders of the defined types were placed in this encounter.    Disposition:   FU with Dr. Antoine Poche as needed  Signed, Leanna Battles  02/25/2017 10:46 AM    [facility] Phone: [phone]; Fax: [phone]  This note was written with the assistance of speech recognition software. Please excuse any transcriptional errors.

## 2017-02-25 NOTE — Patient Instructions (Signed)
Medication Instructions:  Your physician recommends that you continue on your current medications as directed. Please refer to the Current Medication list given to you today. If you need a refill on your cardiac medications before your next appointment, please call your pharmacy.  Follow-Up: Your physician wants you to follow-up in: AS NEEDED WITH [facility].   [facility]

## 2017-02-27 ENCOUNTER — Ambulatory Visit (INDEPENDENT_AMBULATORY_CARE_PROVIDER_SITE_OTHER): Payer: Self-pay | Admitting: Family Medicine

## 2017-02-27 ENCOUNTER — Ambulatory Visit
Admission: RE | Admit: 2017-02-27 | Discharge: 2017-02-27 | Disposition: A | Discharge status: Home / Self Care | Payer: BLUE CROSS/BLUE SHIELD | Source: Ambulatory Visit | Attending: Sports Medicine | Admitting: Sports Medicine

## 2017-02-27 VITALS — BP 110/81 | Ht 68.0 in | Wt 145.0 lb

## 2017-02-27 DIAGNOSIS — M4316 Spondylolisthesis, lumbar region: Secondary | ICD-10-CM

## 2017-02-27 DIAGNOSIS — M25551 Pain in right hip: Secondary | ICD-10-CM

## 2017-02-27 NOTE — Progress Notes (Addendum)
Chief complaint: Low back pain 4 weeks, discuss MRI findings, right anterior hip snapping 2 weeks  History of present illness: Desiree Santana is a 31 year old female who presents to the sports medicine office today for initial evaluation. She reports of low back pain for the last 4 weeks. She has had quite an eventful last 4 weeks. She initially presented to urgent care back on 01/27/17 for midthoracic back pain, pleuritic chest pain, and shortness of breath. She was routed to the emergency department, as there is concern that she did have DVT/PE. Of note, she recently gave birth to a healthy girl back on 01/05/17. She did have normal spontaneous vaginal delivery, epidural anesthesia was used. In the emergency department, given that she did have have these symptoms, EKG was done which did show sinus tachycardia.CTA the chest was done to rule out PE, which was negative. She did have a low-grade fever of 100.81F. Given this, with history of recent delivery and using epidural anesthesia, there was concern for spinal epidural abscess. MRI of the C-spine, T-spine, L-spine were obtained. Imaging showed that she had chronic bilateral pars defects at L5-S1 associated with 4 mm spondylolisthesis, and mild degenerative disc bulge at C5-6 and L4-5. In the cervical spine there was no significant stenosis. In the L-spine there was mild bilateral L4 foraminal narrowing, but no neural impingement. Fortunately, workup in emergency department concluded that she had viral pericarditis she was started on high-dose ibuprofen and colchicine, with improvement in symptoms.. She was admitted for short period of time in the hospital, has had normal echocardiogram and has since been cleared by cardiology. She wanted a follow-up here today to discuss these MRI findings. she reports of no previous back injury, no major back pains while growing up. She reports that she did playing multiple sports while growing up, including volleyball, soccer. She  reports occasionally after landing from spiking the volleyball she would notice a slight ache in her back, but no symptoms of any major back pain that debilitated her or prevented her from playing or completing a game. She does not report of any time having any numbness, tingling, or weakness in her upper extremities or lower extremities. She does not report of any bowel or bladder incontinence, does not report of any saddle anesthesia. She does not report of any fevers, chills, or night sweats. She has not had use any medications for pain. She is very active at home, has triplets that are 31 years old, in addition to her 352 month old baby now.  She reports noticing for the last 2 weeks having right anterior hip discomfort. Mainly describes having symptoms of hip snapping and catching, feels the sensation along the right anterior hip. She does not report of any hip pain anteriorly, laterally, or posteriorly. She reports that she notices this when doing specific exercises that involve hip external and internal range of motion. She reports noticing a snapping and catching sensation when going from hip internal range of motion to external range of motion, as well as from hip flexion to hip extension. She does not report of any warmth, erythema, ecchymosis, or effusion. She does not report of any limitations in range of motion. She does not report of any limping or any previous right hip injury. Has not had use any medications for pain.  Her past medical history, surgical history, family history, and social history were reviewed today. No significant past medical history or medical conditions. No surgeries involving her back or her hip. No pertinent  family history of any inflammatory/autoimmune disorders. Does not report of any current tobacco use. Additional pertinent information as stated in history of present illness.  Allergies: No known drug allergies  Review of systems: As stated above  Physical  exam: Vital signs reviewed and are documented in chart General: Alert, oriented, appears stated age, in no apparent distress HEENT: Moist oral mucosa Respiratory: Normal respirations, able to speak in full sentences Cardiac: Normal peripheral pulses, regular rate Integumentary: No rashes on visible skin Neurologic: He does have slight weakness with hip abductors, quadriceps strength, include strength, which categorize this as 4+/5 in bilateral hips and quads, otherwise strength 5/5 in bilateral lower extremities, sensation 2+ in bilateral lower extremities Psychiatric: Appropriate affect and mood Musculoskeletal: Inspection of right hip reveals no obvious deformity or muscle atrophy, no warmth, erythema, ecchymosis, or effusion noted, no tenderness palpation along anterior hip, lateral hip, greater trochanteric bursa, or posterior hip and SI joint region, does have full active and passive range of motion with hip flexion, hip extension, hip internal range of motion, and hip external range of motion, do feel thickened IT band, with slight movement going from hip internal range of motion to hip external range of motion, additionally from hip flexion to hip extension, straight leg negative, FABER negative, FADIR negative, inspection of back reveals no obvious deformity or unusual curvature of the spine, warmth, erythema, ecchymosis, or effusion noted, no tenderness noted along the midspine of C-spine, T-spine, and L-spine  Assessment and plan: 1. Low back pain, with MRI showing chronic bilateral pars defects at L5-S1 with 4 mm anterior spondylolisthesis, would categorize this as grade 1 2. MRI evidence of mild degenerative disc disease at C5-6 and L4-5 3. Right snapping hip syndrome, type I  Low back pain -She is not having any significant or notable back pain today, mainly wants to discuss MRI findings and anything that she can do to prevent any interval worsening of the MRI findings and progression  of back pain -Given grade 1 spondylolisthesis and no neurologic/radicular symptoms, discussed good prognosis with her -Discussed no need for any surgical intervention at this time -Discussed physical therapy exercises to do at home, specifically strengthening her core -Discussed with her that at some point in time in the future, not necessarily now or within the next year, she does need to have x-rays of her lumbar spine both in flexion and and extension to evaluate for any slippage -Discussed yoga, Pilates, and continuation of her normal daily activities, discussed no activity limitations -Discussed as needed use of ibuprofen, Motrin, or Aleve for pain  Right hip discomfort -Discussed diagnosis of snapping hip syndrome, discussed etiology of IT band snapping over the greater trochanter, discussed benign nature of this -I discussed to rule out anything structurally, will order for x-ray of her right hip, including AP and frog leg lateral -Discussed core exercises as above  Discussed with patient will call her after x-ray results to discuss x-ray findings. She will follow-up here on as-needed basis if she does not have any interval improvement in symptoms, or has any new or worsening low back pain. Red flag warning signs and symptoms given.  Discussed to go the emergency department if she has any type of bowel or bladder incontinence, as well as saddle anesthesia.   **Addendum 02/27/17 @ 1630** Spoke with Noell over the phone regarding R hip XR results. After personally reviewing XR, she does have some degenerative changes seen, particularly with a pincer deformity seen at the pelvic acetabulum.  Femoral head looks normal, no other acute abnormalities seen. Given these changes and her young age, I would like to have a MR Arthrogram of her right hip done, specifically to rule out labral tear. She is agreeable with me ordering this. Will call her after MR results.   Haynes Kerns, MD Primary  Care Sports Medicine Fellow [facility]

## 2017-02-27 NOTE — Addendum Note (Signed)
Addended by: Rutha BouchardBABNIK, Jocilyn Trego E on: 02/27/2017 04:38 PM   Modules accepted: Orders

## 2017-02-27 NOTE — Patient Instructions (Signed)
It was a pleasure to meet you today. I will call you after your hip x-ray has been done and imaging and radiology interpretation has been forwarded to me. Work on core exercises, doing this a few times a day. He can take ibuprofen, Motrin, or Aleve as needed for pain. If you have any numbness, tingling, or weakness in the arms or legs, please notify us or your primary physician. If you have any problems controlling your bowels or bladder, have any incontinence episodes, or numbness or tingling in the groin region, go immediately to the emergency department.

## 2017-03-14 ENCOUNTER — Ambulatory Visit
Admission: RE | Admit: 2017-03-14 | Discharge: 2017-03-14 | Disposition: A | Discharge status: Home / Self Care | Payer: BLUE CROSS/BLUE SHIELD | Source: Ambulatory Visit | Attending: Sports Medicine | Admitting: Sports Medicine

## 2017-03-14 DIAGNOSIS — M25551 Pain in right hip: Secondary | ICD-10-CM

## 2017-03-14 MED ORDER — IOPAMIDOL (ISOVUE-M 200) INJECTION 41%
15.0000 mL | Freq: Once | INTRAMUSCULAR | Status: AC
  Administered 2017-03-14: 15 mL via INTRA_ARTICULAR

## 2017-03-15 ENCOUNTER — Telehealth: Payer: Self-pay | Admitting: Family Medicine

## 2017-03-15 NOTE — Telephone Encounter (Signed)
Called Desiree Santana's cell phone number, left detailed voicemail message regarding her MR arthrogram of her hip that was done yesterday, 03/14/17. Discussed no evidence of any hip labral tear. Discussed minimal degenerative blunting of the superior acetabular labrum. No focal chondral defect seen. Discussed to continue with her pelvic core and hip abduction exercises. Discussed anti-inflammatory medication and cryotherapy as needed for pain. Will leave things open ended, will have patient follow up on as needed basis. Gave office number to have her call with any questions or concerns.  Haynes Kerns, MD Primary Care Sports Medicine Fellow [facility]

## 2017-09-13 IMAGING — DX DG CHEST 2V
2 series · 2 of 2 positions shown · non-contrast
Comparison: None.

CLINICAL DATA: Initial evaluation for acute chest pain.

EXAM:
CHEST  2 VIEW

[chest pa]
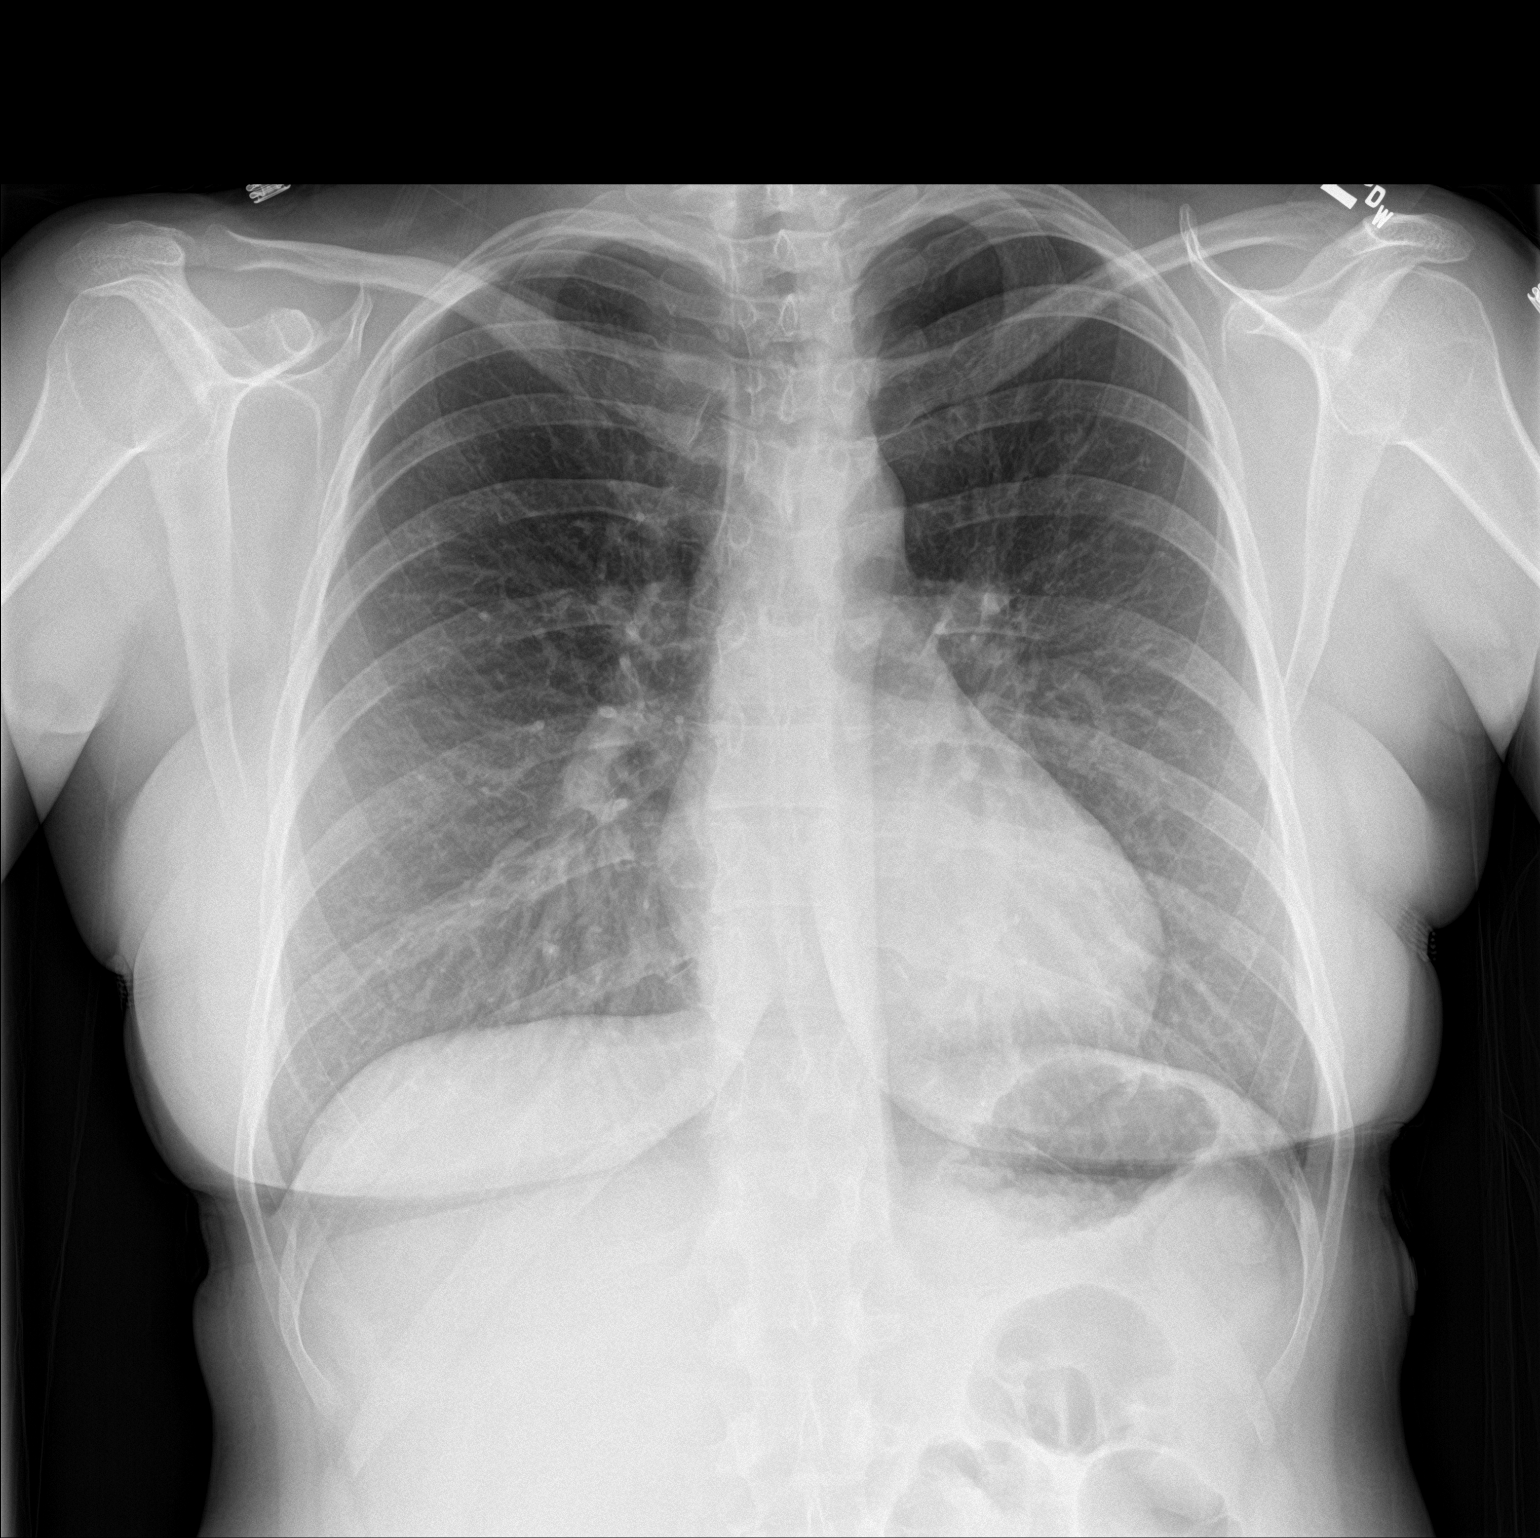

[chest lat]
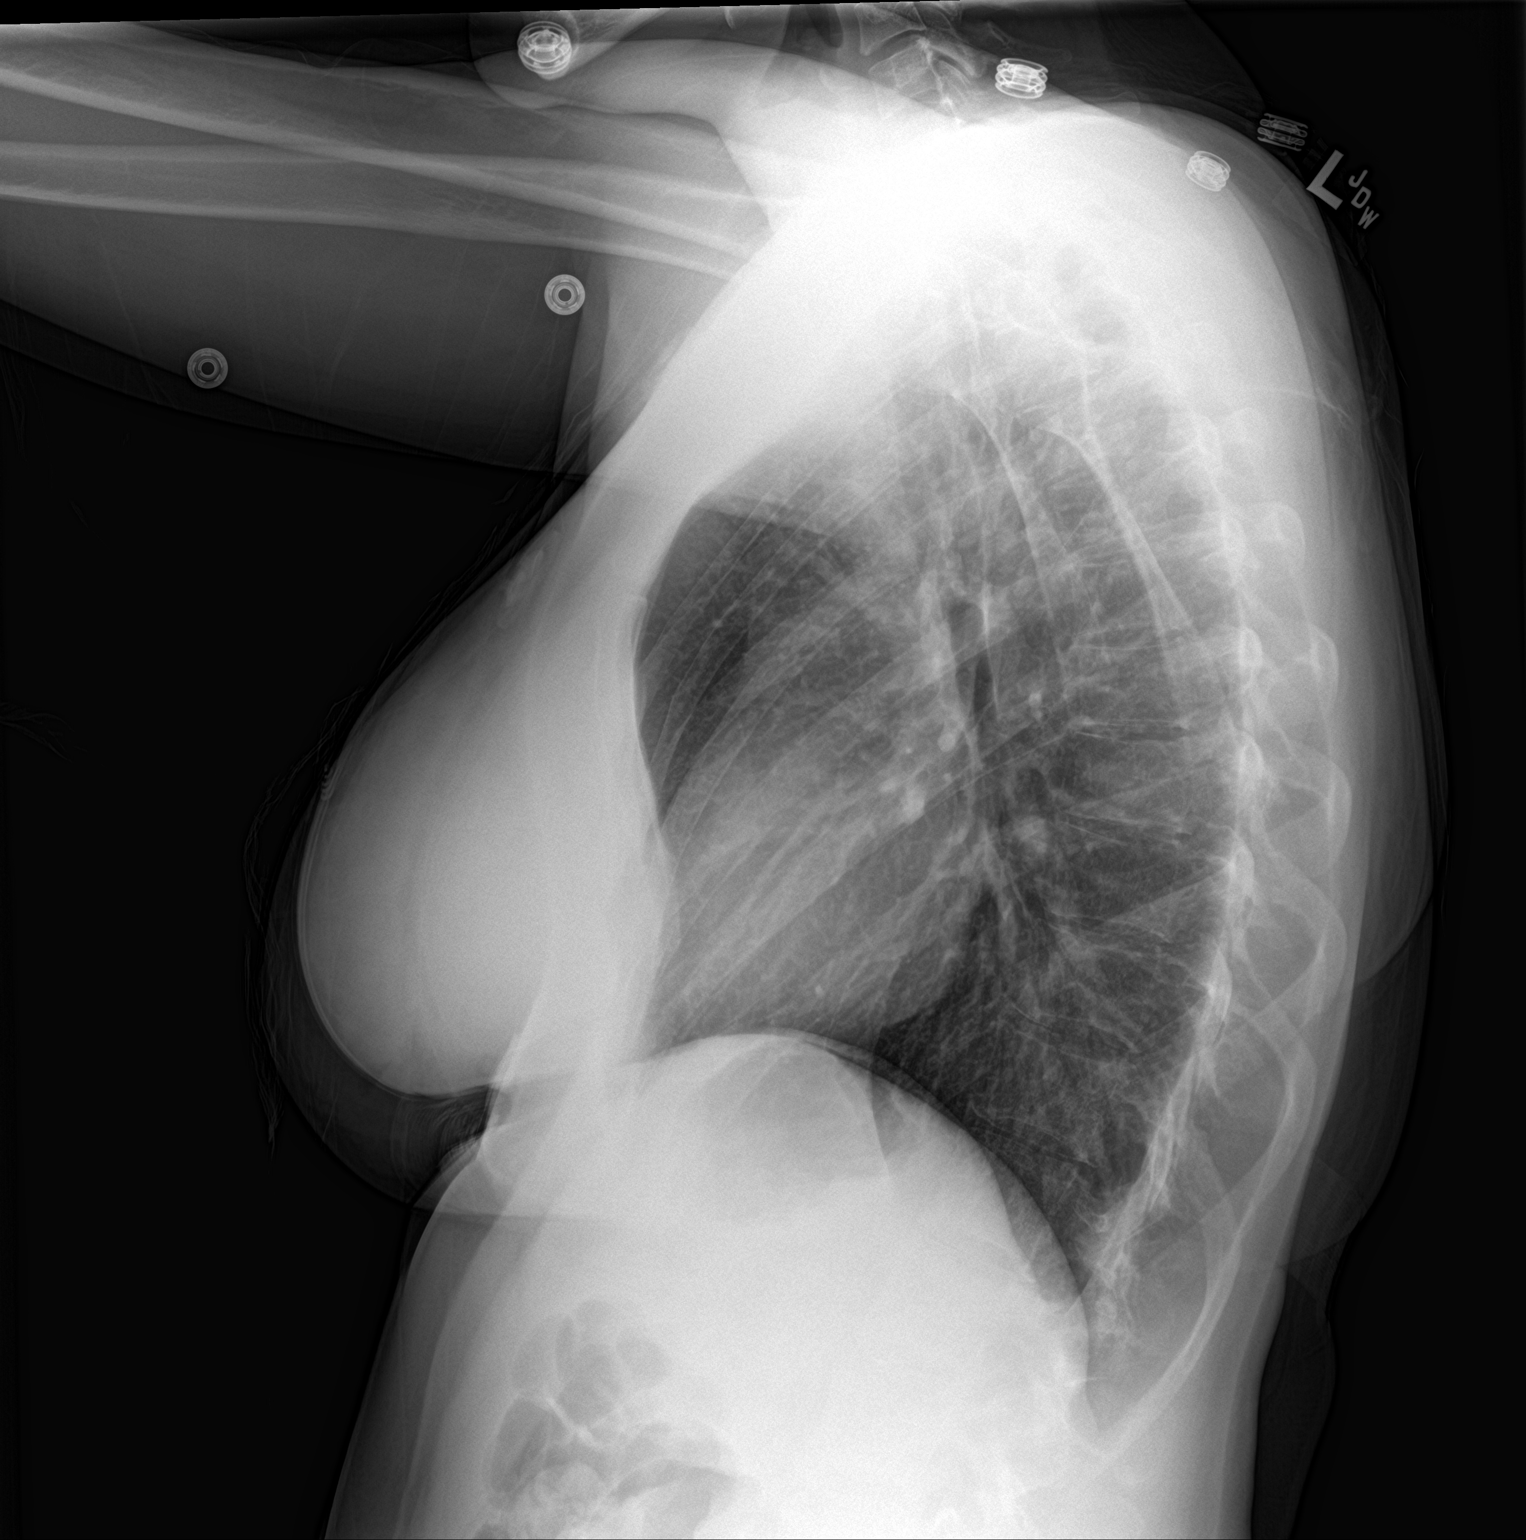

[2 of 2 positions shown; findings below may reference images not displayed]

FINDINGS: The cardiac and mediastinal silhouettes are within normal limits.

The lungs are normally inflated. No airspace consolidation, pleural
effusion, or pulmonary edema is identified. There is no
pneumothorax.

No acute osseous abnormality identified.
IMPRESSION: No active cardiopulmonary disease.

## 2017-09-13 IMAGING — CT CT ANGIO CHEST
2 of 9 series · 19 of 46 positions shown · IV contrast (OMNI)
Comparison: None.

CLINICAL DATA: Recent postpartum status with back pain. Chest pain
and shortness of breath this morning.

EXAM:
CT ANGIOGRAPHY CHEST WITH CONTRAST
TECHNIQUE: Multidetector CT imaging of the chest was performed using the
standard protocol during bolus administration of intravenous
contrast. Multiplanar CT image reconstructions and MIPs were
obtained to evaluate the vascular anatomy.
CONTRAST:  100 mL of Isovue 370

[Series 6: thins · axial · 0.67mm/px · z∈[+1125,+1369]mm · 16 of 276 slices shown]
[im 16/276  lung]
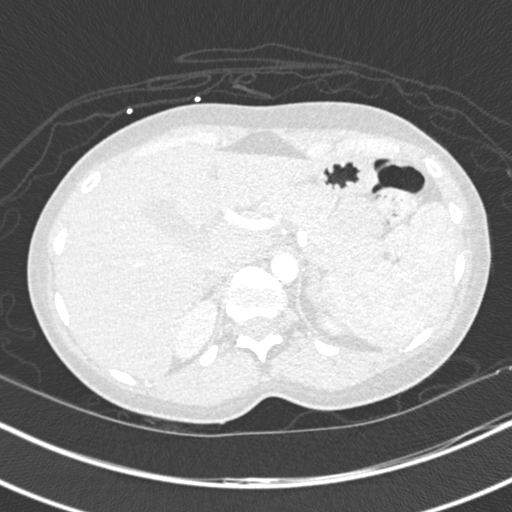
[im 31/276  soft-tissue]
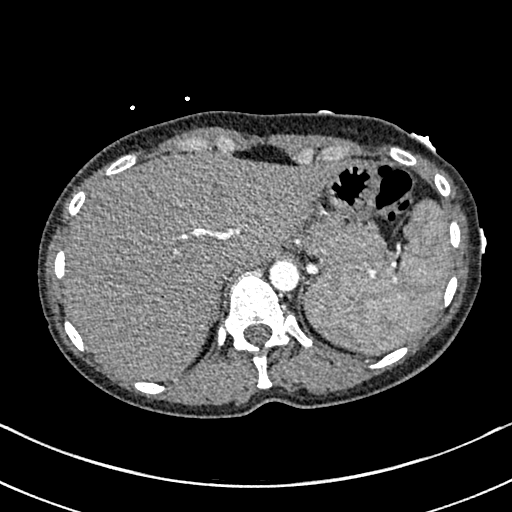
[im 46/276  lung]
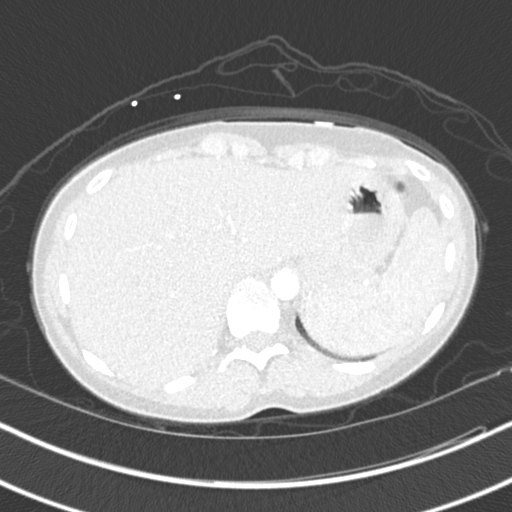
[im 62/276  soft-tissue]
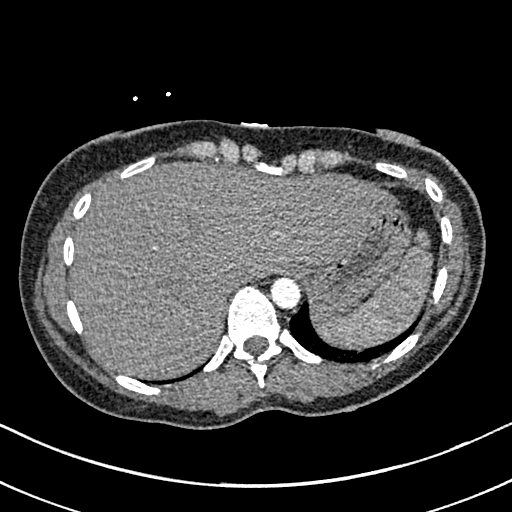
[im 77/276  lung]
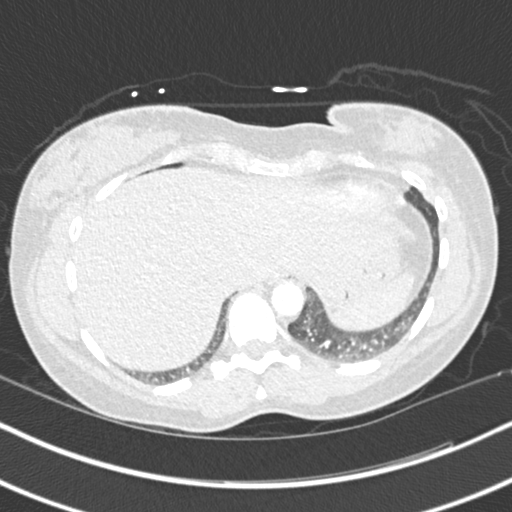
[im 92/276  soft-tissue]
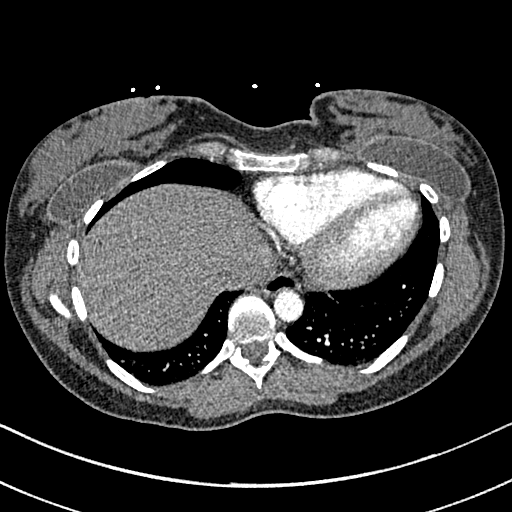
[im 107/276  lung]
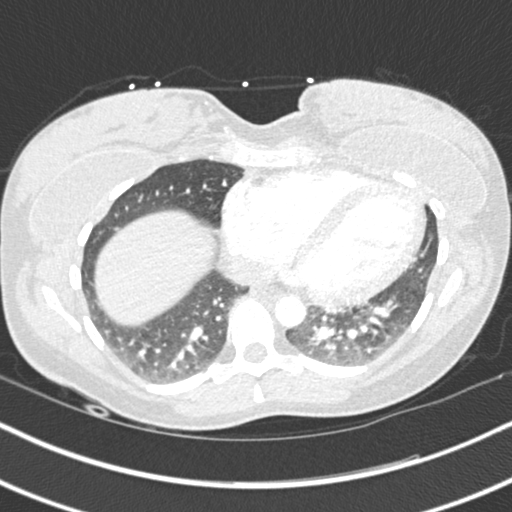
[im 123/276  soft-tissue]
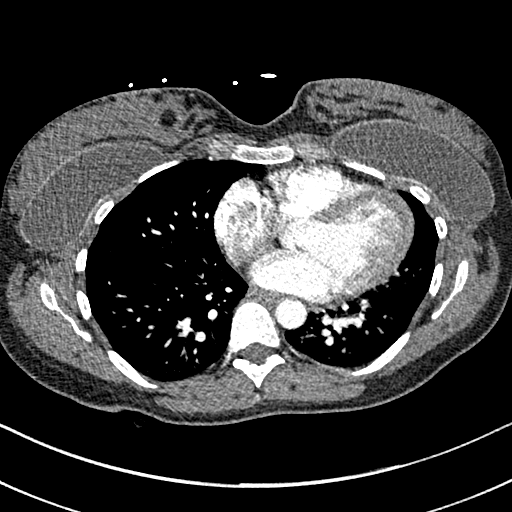
[im 153/276  lung]
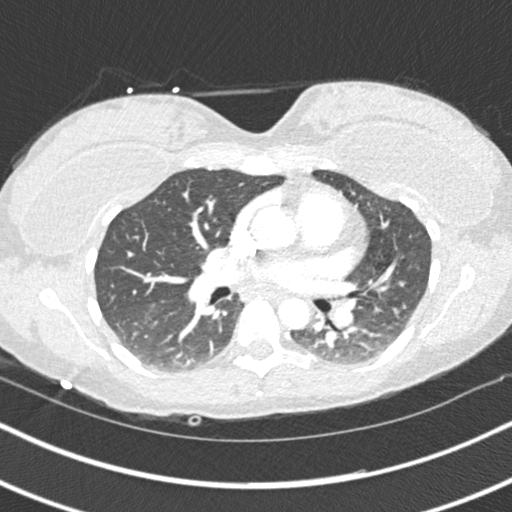
[im 169/276  soft-tissue]
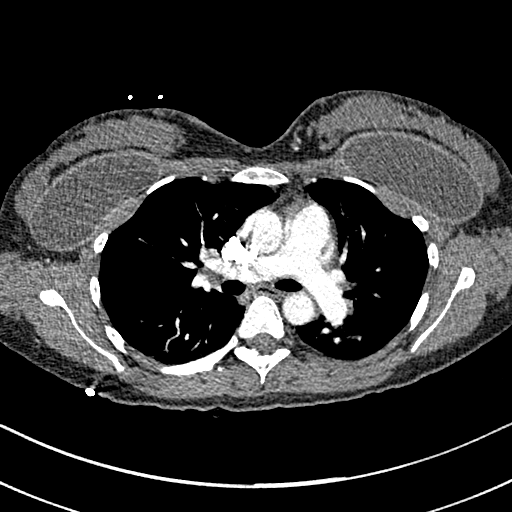
[im 184/276  lung]
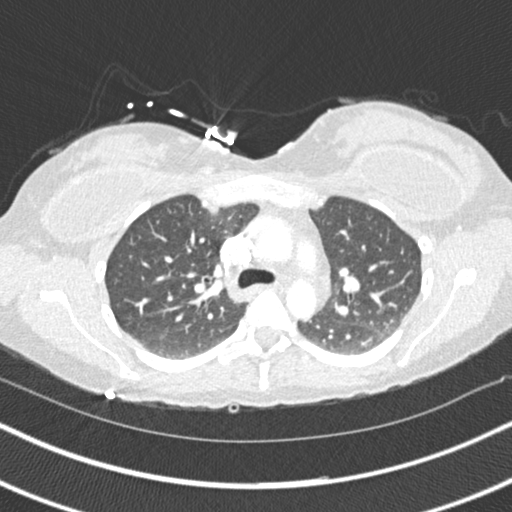
[im 199/276  soft-tissue]
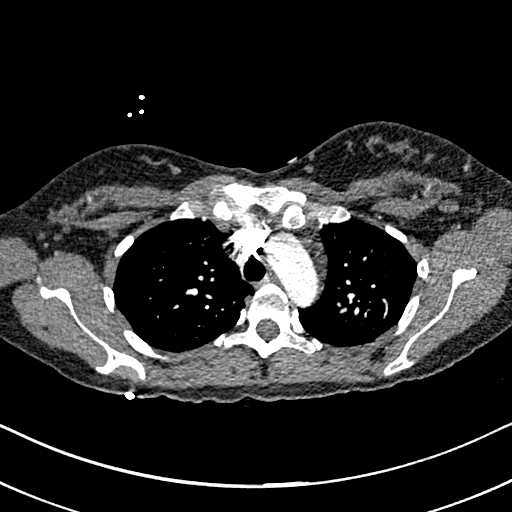
[im 214/276  lung]
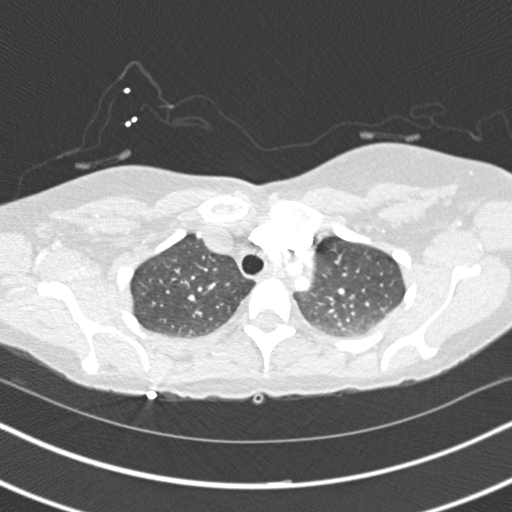
[im 230/276  soft-tissue]
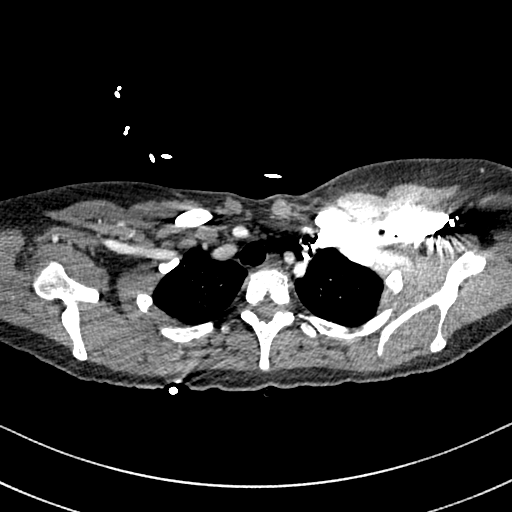
[im 245/276  lung]
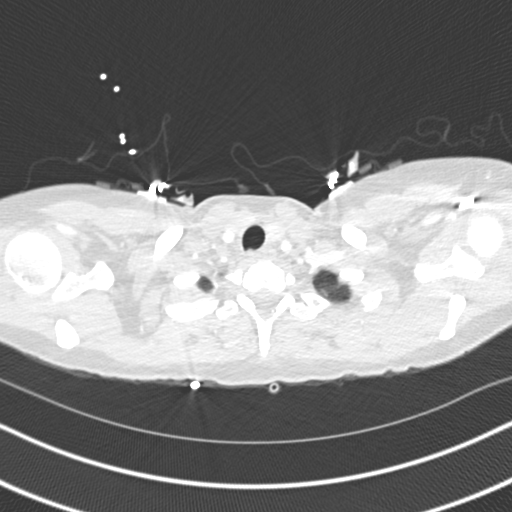
[im 260/276  soft-tissue]
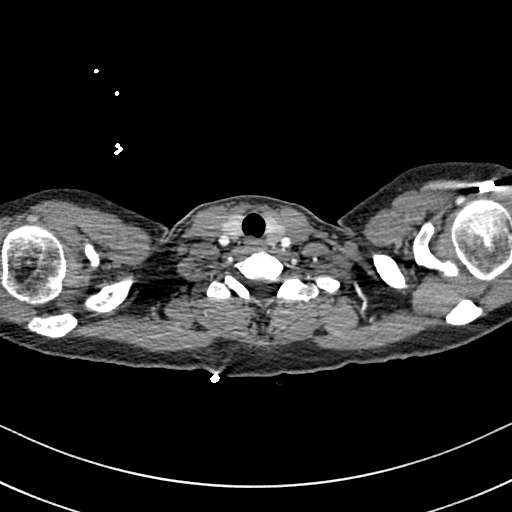

[Series 8: coronal mpr · coronal · 0.59mm/px · 3 of 140 slices shown]
[im 35/140  soft-tissue]
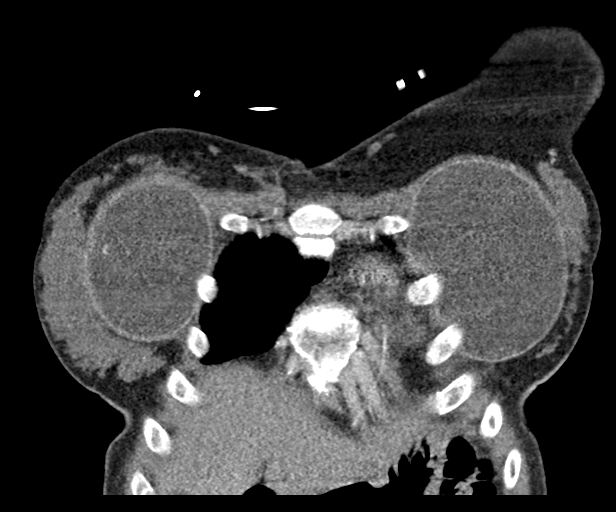
[im 70/140  soft-tissue]
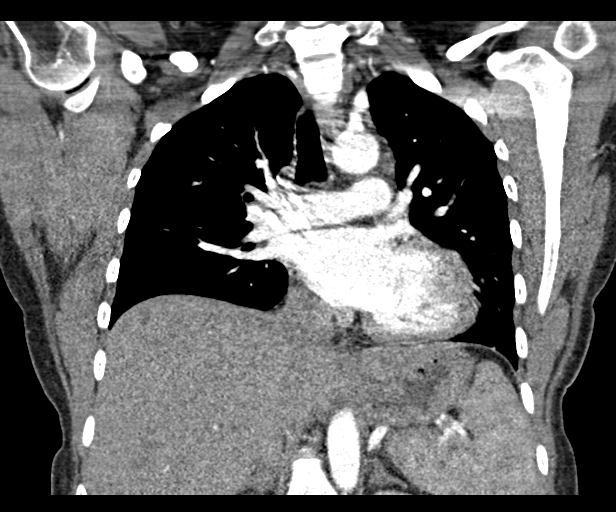
[im 105/140  soft-tissue]
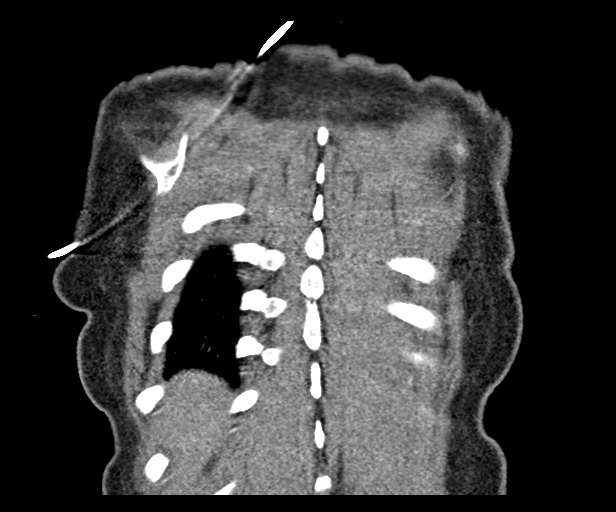

[19 of 46 positions shown; findings below may reference images not displayed]

FINDINGS: Cardiovascular: Satisfactory opacification of the pulmonary arteries
to the segmental level. No evidence of pulmonary embolism. Normal
heart size. No pericardial effusion.

Mediastinum/Nodes: No enlarged mediastinal, hilar, or axillary lymph
nodes. Thyroid gland, trachea, and esophagus demonstrate no
significant findings.

Lungs/Pleura: Lungs are clear. No pleural effusion or pneumothorax.

Upper Abdomen: No acute abnormality.

Musculoskeletal: No chest wall abnormality. No acute or significant
osseous findings.

Review of the MIP images confirms the above findings.
IMPRESSION: No cause for the patient's symptoms identified. No pulmonary emboli
identified.

## 2017-10-29 IMAGING — XA DG FLUORO GUIDE NDL PLC/BX
1 series · 1 of 1 positions shown · non-contrast
Comparison: none

CLINICAL DATA: Right hip pain.

[Series 1: ortho adipose · 1 of 1 slices shown]
[im 1/1]
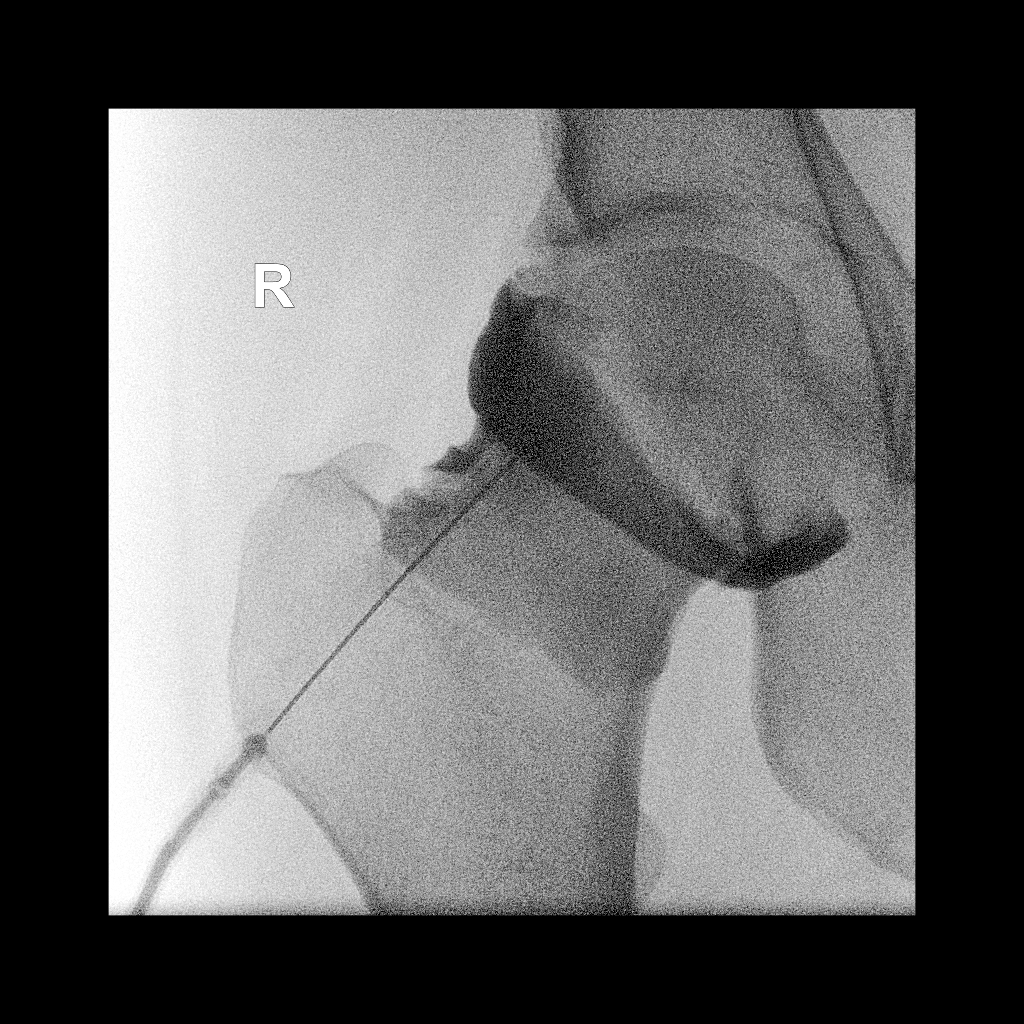

[1 of 1 positions shown; findings below may reference images not displayed]

EXAM:
RIGHT HIP INJECTION FOR MRI

FLUOROSCOPY TIME:  Radiation Exposure Index (if provided by the
fluoroscopic device): 7.25 microGray*m^2

Number of Acquired Spot Images: 0

PROCEDURE:
Overlying skin prepped with Betadine, draped in the usual sterile
fashion, and infiltrated locally with Lidocaine. A 3.5 inch 22 gauge
spinal needle was advanced to the lateral aspect of the right
femoral head-neck junction. 1 mL of 1% lidocaine injected easily. A
mixture of 0.05 mL of MultiHance, 5 mL of 1% lidocaine, and 15 mL of
Isovue-M 200 was then used to opacify the right femoral head. 11 mL
of this mixture were injected. No immediate complication.
IMPRESSION: Technically successful right hip injection under fluoroscopy for MR
arthrogram.
